# Patient Record
Sex: Female | Born: 1975 | Race: Black or African American | Hispanic: No | Marital: Married | Smoking: Never smoker
Health system: Southern US, Community
[De-identification: ages and names within clinical notes are randomized; demographics above are authoritative.]

## PROBLEM LIST (undated history)

## (undated) ENCOUNTER — Inpatient Hospital Stay (HOSPITAL_COMMUNITY): Payer: Self-pay

## (undated) DIAGNOSIS — E041 Nontoxic single thyroid nodule: Secondary | ICD-10-CM

## (undated) DIAGNOSIS — G43909 Migraine, unspecified, not intractable, without status migrainosus: Secondary | ICD-10-CM

## (undated) HISTORY — PX: THYROID CYST EXCISION: SHX2511

## (undated) HISTORY — PX: FOOT SURGERY: SHX648

---

## 2002-01-09 ENCOUNTER — Other Ambulatory Visit: Admission: RE | Admit: 2002-01-09 | Discharge: 2002-01-09 | Payer: Self-pay | Admitting: Family Medicine

## 2003-09-23 ENCOUNTER — Ambulatory Visit (HOSPITAL_COMMUNITY): Admission: RE | Admit: 2003-09-23 | Discharge: 2003-09-23 | Payer: Self-pay | Admitting: Unknown Physician Specialty

## 2005-02-02 ENCOUNTER — Ambulatory Visit (HOSPITAL_COMMUNITY): Admission: RE | Admit: 2005-02-02 | Discharge: 2005-02-02 | Payer: Self-pay | Admitting: *Deleted

## 2006-08-02 ENCOUNTER — Ambulatory Visit (HOSPITAL_COMMUNITY): Admission: RE | Admit: 2006-08-02 | Discharge: 2006-08-02 | Payer: Self-pay | Admitting: Family Medicine

## 2007-05-30 ENCOUNTER — Ambulatory Visit (HOSPITAL_COMMUNITY): Admission: RE | Admit: 2007-05-30 | Discharge: 2007-05-30 | Payer: Self-pay | Admitting: Family Medicine

## 2007-07-09 ENCOUNTER — Inpatient Hospital Stay (HOSPITAL_COMMUNITY): Admission: AD | Admit: 2007-07-09 | Discharge: 2007-07-09 | Payer: Self-pay | Admitting: Obstetrics and Gynecology

## 2007-07-27 ENCOUNTER — Inpatient Hospital Stay (HOSPITAL_COMMUNITY): Admission: AD | Admit: 2007-07-27 | Discharge: 2007-07-27 | Payer: Self-pay | Admitting: Obstetrics and Gynecology

## 2008-02-21 ENCOUNTER — Inpatient Hospital Stay (HOSPITAL_COMMUNITY): Admission: AD | Admit: 2008-02-21 | Discharge: 2008-02-24 | Payer: Self-pay | Admitting: Obstetrics and Gynecology

## 2009-01-24 ENCOUNTER — Ambulatory Visit (HOSPITAL_COMMUNITY): Admission: RE | Admit: 2009-01-24 | Discharge: 2009-01-24 | Payer: Self-pay | Admitting: Family Medicine

## 2009-07-15 ENCOUNTER — Observation Stay (HOSPITAL_COMMUNITY): Admission: AD | Admit: 2009-07-15 | Discharge: 2009-07-16 | Payer: Self-pay | Admitting: Obstetrics and Gynecology

## 2010-02-20 ENCOUNTER — Inpatient Hospital Stay (HOSPITAL_COMMUNITY): Admission: AD | Admit: 2010-02-20 | Discharge: 2010-02-22 | Payer: Self-pay | Admitting: Obstetrics and Gynecology

## 2010-07-22 ENCOUNTER — Encounter: Payer: Self-pay | Admitting: Internal Medicine

## 2010-08-02 ENCOUNTER — Encounter: Payer: Self-pay | Admitting: Internal Medicine

## 2010-08-02 LAB — CONVERTED CEMR LAB
ALT: 13 units/L
AST: 16 units/L
Alkaline Phosphatase: 46 units/L
Amylase: 40 units/L
Hemoglobin: 12.6 g/dL
Lipase: 15 units/L
MCV: 80 fL
Platelets: 227 10*3/uL
Total Bilirubin: 0.6 mg/dL
WBC: 8.9 10*3/uL

## 2010-08-10 ENCOUNTER — Encounter: Payer: Self-pay | Admitting: Internal Medicine

## 2010-08-10 ENCOUNTER — Emergency Department (HOSPITAL_COMMUNITY): Admission: RE | Admit: 2010-08-10 | Discharge: 2010-08-10 | Payer: Self-pay | Admitting: Internal Medicine

## 2010-08-11 ENCOUNTER — Encounter: Payer: Self-pay | Admitting: Internal Medicine

## 2010-08-12 ENCOUNTER — Encounter (INDEPENDENT_AMBULATORY_CARE_PROVIDER_SITE_OTHER): Payer: Self-pay | Admitting: *Deleted

## 2010-09-27 ENCOUNTER — Ambulatory Visit: Payer: Self-pay | Admitting: Internal Medicine

## 2010-09-27 DIAGNOSIS — G43909 Migraine, unspecified, not intractable, without status migrainosus: Secondary | ICD-10-CM | POA: Insufficient documentation

## 2010-09-27 DIAGNOSIS — R1033 Periumbilical pain: Secondary | ICD-10-CM | POA: Insufficient documentation

## 2010-12-14 NOTE — Letter (Signed)
Summary: New Patient letter  [facility]  [address] [place], [place] [address]   Phone: [phone]  Fax: [phone]       08/12/2010 MRN: [id number]  [name] [address] [place], [place]  [address]  Dear Ms. [facility],  Welcome to the Gastroenterology Division at [facility].    You are scheduled to see Dr. Leone Payor on 09/27/2010 at 3:00pm on the 3rd floor at [facility], [address]  We ask that you try to arrive at our office 15 minutes prior to your appointment time to allow for check-in.  We would like you to complete the enclosed self-administered evaluation form prior to your visit and bring it with you on the day of your appointment.  We will review it with you.  Also, please bring a complete list of all your medications or, if you prefer, bring the medication bottles and we will list them.  Please bring your insurance card so that we may make a copy of it.  If your insurance requires a referral to see a specialist, please bring your referral form from your primary care physician.  Co-payments are due at the time of your visit and may be paid by cash, check or credit card.     Your office visit will consist of a consult with your physician (includes a physical exam), any laboratory testing he/she may order, scheduling of any necessary diagnostic testing (e.g. x-ray, ultrasound, CT-scan), and scheduling of a procedure (e.g. Endoscopy, Colonoscopy) if required.  Please allow enough time on your schedule to allow for any/all of these possibilities.    If you cannot keep your appointment, please call [phone] to cancel or reschedule prior to your appointment date.  This allows [place] the opportunity to schedule an appointment for another patient in need of care.  If you do not cancel or reschedule by 5 p.m. the business day prior to your appointment date, you will be charged a $50.00 late cancellation/no-show fee.    Thank you for  choosing [facility] for your medical needs.  We appreciate the opportunity to care for you.  Please visit [place] at our website  to learn more about our practice.                     Sincerely,                                                             The Gastroenterology Division

## 2010-12-14 NOTE — Letter (Signed)
Summary: [facility]   Imported By: Lennie Odor 10/05/2010 09:58:31  _____________________________________________________________________  External Attachment:    Type:   Image     Comment:   External Document

## 2010-12-14 NOTE — Letter (Signed)
Summary: [facility]   Imported By: Lennie Odor 10/05/2010 09:59:52  _____________________________________________________________________  External Attachment:    Type:   Image     Comment:   External Document

## 2010-12-14 NOTE — Assessment & Plan Note (Signed)
Summary: abdomain pain/lk   History of Present Illness Visit Type: Initial Visit Primary GI MD: Stan Head MD [facility] Primary Provider: [facility] Chief Complaint: Abdominal pain History of Present Illness:   April 26, 2010 she developed sudden upper abdomen pain with with radiation into rectum and vagina. She had a child in april, vaginal delivery and she ?ed if related. She had recurrent pain about every week, lasts 3 days, without obvious trigers. She feels nauseous most of time. She is awake frequently with child but in general, unless in a 3 day spell of pain she is not having nocturnal symtoms. Bowels ok. Tried laxatives, Coke (to settle stomach), teas, giner ale, heating pad with variable results bt no complete relief.   GI Review of Systems    Reports abdominal pain, bloating, and  nausea.     Location of  Abdominal pain: upper abdomen.    Denies acid reflux, belching, chest pain, dysphagia with liquids, dysphagia with solids, heartburn, loss of appetite, vomiting, vomiting blood, weight loss, and  weight gain.      Reports hemorrhoids.     Denies anal fissure, black tarry stools, change in bowel habit, constipation, diarrhea, diverticulosis, fecal incontinence, heme positive stool, irritable bowel syndrome, jaundice, light color stool, liver problems, rectal bleeding, and  rectal pain. Preventive Screening-Counseling & Management  Alcohol-Tobacco     Smoking Status: never      Drug Use:  no.      Current Medications (verified): 1)  Imitrex Statdose System 4 Mg/0.49ml Soln (Sumatriptan Succinate) .... As Needed For Migraine Headaches  Allergies (verified): 1)  ! * Ivp Dye  Past History:  Past Medical History: Reviewed history from 09/24/2010 and no changes required. Thyroid Tumor Chronic Headaches  Past Surgical History: Reviewed history from 09/24/2010 and no changes required. 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  Alcohol Use - no Daily Caffeine Use Illicit Drug Use - no Smoking Status:  never Drug Use:  no  Review of Systems       Chronic migraines, actually a bit better these days All other ROS negative except as per HPI.   Vital Signs:  Patient profile:   35 year old female Height:      67 inches Weight:      161.38 pounds BMI:     25.37 Pulse rate:   72 / minute Pulse rhythm:   regular BP sitting:   90 / 60  (left arm) Cuff size:   regular  Vitals Entered By: June McMurray CMA Duncan Dull) (September 27, 2010 3:25 PM)  Physical Exam  General:  Well developed, well nourished, no acute distress. Eyes:  PERRLA, no icterus. Mouth:  No deformity or lesions, dentition normal. Neck:  Supple; no masses or thyromegaly. Lungs:  Clear throughout to auscultation. Heart:  Regular rate and rhythm; no murmurs, rubs,  or bruits. Abdomen:  soft, mildly distended BS+ minimally tender with deep palpation in mid-abdomen no masses Extremities:  No clubbing, cyanosis, edema or deformities noted. Cervical Nodes:  No significant cervical or supraclavicular adenopathy.  Psych:  Alert and cooperative. Normal mood and affect.  Old office nores reviewed. CBC, CMET, lipase, amylase, hCG negative 08/02/10  Impression & Recommendations:  Problem # 1:  ABDOMINAL PAIN, PERIUMBILICAL (ICD-789.05) Assessment New The location, characteristics of onset and negative to work-up to date raise possibility of abdominal migraine especially  since she has migraine headaches. I have asked her to try her Imitrex with next attack and to let me know results of that therapeutic trial. If that is helpful then will consider suppressive therapy with tricyclic.  These problems began about 2 months after delivery of fifth child but do not seem to be related.  Problem # 2:  MIGRAINE HEADACHE (ICD-346.90) Assessment: New  CT  Abdomen/Pelvis  Procedure date:  08/10/2010  Findings:      Prominence of left gonadal vein Numbness of face, tongue and throat - suspected contrast reaction  otherwise negative   Patient Instructions: 1)  Try taking Imitrex when you get abdominal pain. 2)  Call our office to let us know how this works. 3)  Information on Abdominal Migraine given. 4)  Copy sent to : Elfredia Nevins, MD 5)  The medication list was reviewed and reconciled.  All changed / newly prescribed medications were explained.  A complete medication list was provided to the patient / caregiver.

## 2011-02-02 LAB — CBC
HCT: 29.6 % — ABNORMAL LOW (ref 36.0–46.0)
HCT: 32.2 % — ABNORMAL LOW (ref 36.0–46.0)
Hemoglobin: 10.3 g/dL — ABNORMAL LOW (ref 12.0–15.0)
Hemoglobin: 9.5 g/dL — ABNORMAL LOW (ref 12.0–15.0)
MCHC: 32 g/dL (ref 30.0–36.0)
MCHC: 32.2 g/dL (ref 30.0–36.0)
MCV: 76.1 fL — ABNORMAL LOW (ref 78.0–100.0)
MCV: 76.2 fL — ABNORMAL LOW (ref 78.0–100.0)
Platelets: 149 10*3/uL — ABNORMAL LOW (ref 150–400)
Platelets: 197 10*3/uL (ref 150–400)
RBC: 3.89 MIL/uL (ref 3.87–5.11)
RBC: 4.23 MIL/uL (ref 3.87–5.11)
RDW: 18.7 % — ABNORMAL HIGH (ref 11.5–15.5)
RDW: 18.9 % — ABNORMAL HIGH (ref 11.5–15.5)
WBC: 11.5 10*3/uL — ABNORMAL HIGH (ref 4.0–10.5)
WBC: 14 10*3/uL — ABNORMAL HIGH (ref 4.0–10.5)

## 2011-02-02 LAB — RPR: RPR Ser Ql: NONREACTIVE

## 2011-02-18 LAB — URINALYSIS, ROUTINE W REFLEX MICROSCOPIC
Bilirubin Urine: NEGATIVE
Glucose, UA: NEGATIVE mg/dL
Hgb urine dipstick: NEGATIVE
Ketones, ur: NEGATIVE mg/dL
Nitrite: NEGATIVE
Protein, ur: NEGATIVE mg/dL
Specific Gravity, Urine: >1.03 — ABNORMAL HIGH (ref 1.005–1.030)
Urobilinogen, UA: 0.2 mg/dL (ref 0.0–1.0)
pH: 5.5 (ref 5.0–8.0)

## 2011-04-01 NOTE — H&P (Signed)
[name], [name] NO.:  [id number]   MEDICAL RECORD NO.:  [id number]          PATIENT TYPE:  AMB   LOCATION:  DAY                           FACILITY:  [facility]   PHYSICIAN:  Langley Gauss, MD     DATE OF BIRTH:  1976/01/01   DATE OF ADMISSION:  02/02/2005  DATE OF DISCHARGE:  LH                                HISTORY & PHYSICAL   Planned procedure is suction, dilatation, and curettage.   The patient is a 35 year old gravida 5, para 3 with a last menstrual period  sometime in January 2006 who is referred for suction D&C for findings of  missed AB. Pertinently, by patient's history, she had a positive pregnancy  test in February. About two weeks previously, she underwent elective  termination of pregnancy at another facility. The patient states that no  ultrasound was performed prior to the procedure, but history indicates that  this was a first trimester done utilizing a local and was very poorly  tolerated by the patient. The patient states that she was seen one week  followup status post procedure at that clinic at which time it was noted  that her findings were normal. The patient subsequently has contacted them,  stating that she still has pregnancy symptoms, nausea and vomiting as well  as mild pelvic cramping like onset of menses, but no vaginal bleeding. She  has been notified that she is not any longer pregnant. The patient  subsequently, however, has been seen at [facility] at which time a  quantitative beta HCG was performed on January 31, 2005 with a result obtained  of 37,204.   The patient did follow up appropriately following the termination. She also  recalls taking Vibramycin for antibiotic coverage.   PAST MEDICAL HISTORY:  She has three prior vaginal deliveries without  complications, most recent being October 27, 2000. She has no known drug  allergies. She does have a history of a benign cyst removed from her thyroid  but is not  requiring any thyroid replacement therapy.   SOCIAL HISTORY:  The patient is a nonsmoker. She works for [employer].   PHYSICAL EXAMINATION:  GENERAL:  She is no acute distress.  VITAL SIGNS:  Weight is 134, blood pressure is 110/58, pulse rate 80,  respiratory rate of 20.  HEENT:  Negative. No adenopathy. Neck is supple. Mucous membranes are moist.  LUNGS:  Clear.  CARDIOVASCULAR:  Regular rate and rhythm.  ABDOMEN:  Soft and nontender. No surgical scars are identified. No pelvic  masses are appreciated.  EXTREMITIES:  Normal.  PELVIC:  Normal. External genitalia no lesions or ulcerations identified.  Cervix without lesions. No vaginal bleeding or abnormal discharge is  identified. Bimanual examination reveals a sharply retroflexed uterus,  slightly enlarged. No adnexal tenderness. No adnexal pain.   A transvaginal ultrasound is performed in the office, interpreted by Dr.  Roylene Reason. Lisette Grinder which reveals normal appearing left and right ovaries,  small amount of free fluid within the pelvis; identified within the uterus,  though, is a well defined fluid collection which contains a fetal  pole  consistent with 7 weeks and 4 days, no fetal movement, no fetal cardiac  activity is identified.   ASSESSMENT/PLAN:  The patient is status post termination of pregnancy;  however, as the patient states, this was done utilizing local analgesic only  and was poorly tolerated by the patient. It was complicated by her markedly  retroflexed uterus. Impression is that of incomplete termination of the  pregnancy with what appears to be a fetal pole now present with no cardiac  activity. Thus, diagnosis is missed abortion. The patient is scheduled for  suction D&C performed February 02, 2005 rather than wait for the onset of  vaginal bleeding. Of note, patient had again been in contact with the clinic  performing the TOP at which time she was given what appears to be two  Cytotec to be  taken orally and two to be placed intravaginally. Risks and  benefits of the surgical procedure discussed with the patient. This is to be  performed on February 02, 2005.      DC/MEDQ  D:  02/02/2005  T:  02/02/2005  Job:  [id number]

## 2011-04-01 NOTE — Op Note (Signed)
NAMESHERL, YZAGUIRRE NO.:  [id number]   MEDICAL RECORD NO.:  [id number]          PATIENT TYPE:  AMB   LOCATION:  DAY                           FACILITY:  [facility]   PHYSICIAN:  Langley Gauss, MD     DATE OF BIRTH:  Nov 06, 1976   DATE OF PROCEDURE:  02/02/2005  DATE OF DISCHARGE:  02/02/2005                                 OPERATIVE REPORT   PREOPERATIVE DIAGNOSES:  First trimester pregnancy estimated at 10-12, weeks  status post incomplete elective termination of pregnancy.  Diagnoses thus is  complication of induced abortion.   PROCEDURE PERFORMED:  Suction D&C.   SURGEON:  Dr. Roylene Reason. Lisette Grinder.   ANESTHESIA:  General endotracheal.   SPECIMENS:  Obvious products conception to pathology laboratory for  permanent section only.   COMPLICATIONS:  None.   ESTIMATED BLOOD LOSS:  100 cc.   SUMMARY:  The patient had been evaluated preoperatively, was noted to have  elevated quantitative beta HCG of 37, 204 an ultrasound consistent with  retained products conception. The patient however was not having any vaginal  bleeding. She was taken the operating vital signs were stable. She underwent  uncomplicated induction of general anesthesia at which time she was placed  in dorsolithotomy position, prepped and draped usual sterile manner. Sterile  speculum examination was performed. There was some brown mucusy discharge  present at the endocervix.  The cervix was noted to be dilated sufficiently  to allow passage of a #8 suction catheter tip.  The anterior lip of cervix  was grasped with a single-tooth tenaculum. An 8 mm suction catheter tip was  then inserted into the uterine cavity. Gentle suction is then applied with a  circular withdrawing motion. The entire uterine cavity was suctioned of  obvious retained products of conception. This was repeated times one with  the suction catheter tip reintroduced up to the level of the fundus.  Again  suction curettage  performed the entire uterine cavity. Minimal tissues  obtained on the second pass.  A fine banjo curettage was then performed of  the uterine cavity until a fine gritty sensation was appreciated in all  quadrants of the uterus to include the uterine fundus no additional tissue  was obtained. Thus the procedure was terminated. Bimanual examination  revealed a 10-week size uterus.  Fundal massage was performed to improve  uterine tone.  Minimal active bleeding is noted. The patient is treated with  0.2 milligrams of IM Methergine.  She is reversed of anesthesia, taken out  of dorsolithotomy position and taken to the recovery room in stable  condition. The patient did have a family member waiting who was well aware  of the patient's recent termination of pregnancy, difficulties she has had  subsequent to performance of that procedure and she was made aware that no  complications were obtained of during today's procedure.   DISPOSITION DISCHARGE SUMMARY:  Operative procedure performed on February 03, 2003.  The patient that did well postoperatively in the recovery room, had  minimal vaginal bleeding and cramping was controlled utilizing IM Toradol.  The patient's vital signs remained  stable. She has satisfied all criteria  for discharge, was thus discharged home on February 03, 2003.  Patient given  copy standard discharge instructions.  She is to follow-up in the office in  one weeks' time at which time pathology results can be reviewed as well as  discussion of birth control options.   PERTINENT LABORATORY STUDIES:  O+ blood type, hemoglobin 11.0, hematocrit  32.4.   MEDICATIONS:  That the patient has started p.o. doxycycline two days prior  to the procedure.  This will be continued b.i.d. times five days  postoperatively.  She is also given a prescription for Vicoprofen #30 with  no refill for postoperative pain relief   The patient did state to me that the initial operative procedure had  been  performed in [facility]. This was elective termination pregnancy. The  patient has multiple questions regarding that procedure and why it was  reperformed at this time.  I told her I was unable to specifically answer  any questions regarding the initial performance of the procedure but did  state to her that undoubtedly, the initial procedure was technically  markedly more difficult than today's procedure as in the outpatient clinic,  certainly probably a paracervical block had been utilized where as today  utilizing general anesthesia, the patient tolerance was complete.      DC/MEDQ  D:  03/02/2005  T:  03/02/2005  Job:  [id number]

## 2011-04-07 ENCOUNTER — Ambulatory Visit (HOSPITAL_COMMUNITY)
Admission: RE | Admit: 2011-04-07 | Discharge: 2011-04-07 | Disposition: A | Discharge status: Home / Self Care | Payer: Managed Care, Other (non HMO) | Source: Ambulatory Visit | Attending: Preventative Medicine | Admitting: Preventative Medicine

## 2011-04-07 ENCOUNTER — Other Ambulatory Visit (HOSPITAL_COMMUNITY): Payer: Self-pay | Admitting: Preventative Medicine

## 2011-04-07 DIAGNOSIS — S0990XA Unspecified injury of head, initial encounter: Secondary | ICD-10-CM | POA: Insufficient documentation

## 2011-04-07 DIAGNOSIS — R51 Headache: Secondary | ICD-10-CM | POA: Insufficient documentation

## 2011-05-16 ENCOUNTER — Inpatient Hospital Stay (HOSPITAL_COMMUNITY)
Admission: AD | Admit: 2011-05-16 | Discharge: 2011-05-16 | Disposition: A | Discharge status: Home / Self Care | Payer: Managed Care, Other (non HMO) | Source: Ambulatory Visit | Attending: Obstetrics and Gynecology | Admitting: Obstetrics and Gynecology

## 2011-05-16 DIAGNOSIS — O210 Mild hyperemesis gravidarum: Secondary | ICD-10-CM

## 2011-05-16 LAB — COMPREHENSIVE METABOLIC PANEL
ALT: 34 U/L (ref 0–35)
AST: 22 U/L (ref 0–37)
Albumin: 3.5 g/dL (ref 3.5–5.2)
Alkaline Phosphatase: 49 U/L (ref 39–117)
BUN: 13 mg/dL (ref 6–23)
CO2: 25 mEq/L (ref 19–32)
Calcium: 9.3 mg/dL (ref 8.4–10.5)
Chloride: 96 mEq/L (ref 96–112)
Creatinine, Ser: 0.48 mg/dL — ABNORMAL LOW (ref 0.50–1.10)
GFR calc Af Amer: >60 mL/min (ref >60)
GFR calc non Af Amer: >60 mL/min (ref >60)
Glucose, Bld: 97 mg/dL (ref 70–99)
Potassium: 3.6 mEq/L (ref 3.5–5.1)
Sodium: 130 mEq/L — ABNORMAL LOW (ref 135–145)
Total Bilirubin: 0.4 mg/dL (ref 0.3–1.2)
Total Protein: 7 g/dL (ref 6.0–8.3)

## 2011-05-16 LAB — CBC
HCT: 34.3 % — ABNORMAL LOW (ref 36.0–46.0)
Hemoglobin: 11.2 g/dL — ABNORMAL LOW (ref 12.0–15.0)
MCH: 25 pg — ABNORMAL LOW (ref 26.0–34.0)
MCHC: 32.7 g/dL (ref 30.0–36.0)
MCV: 76.6 fL — ABNORMAL LOW (ref 78.0–100.0)
Platelets: 277 10*3/uL (ref 150–400)
RBC: 4.48 MIL/uL (ref 3.87–5.11)
RDW: 15.3 % (ref 11.5–15.5)
WBC: 11.3 10*3/uL — ABNORMAL HIGH (ref 4.0–10.5)

## 2011-05-16 LAB — URINE MICROSCOPIC-ADD ON

## 2011-05-16 LAB — URINALYSIS, ROUTINE W REFLEX MICROSCOPIC
Bilirubin Urine: NEGATIVE
Glucose, UA: NEGATIVE mg/dL
Ketones, ur: 15 mg/dL — AB
Leukocytes, UA: NEGATIVE
Nitrite: NEGATIVE
Protein, ur: 30 mg/dL — AB
Specific Gravity, Urine: >1.03 — ABNORMAL HIGH (ref 1.005–1.030)
Urobilinogen, UA: 0.2 mg/dL (ref 0.0–1.0)
pH: 6 (ref 5.0–8.0)

## 2011-05-26 LAB — GC/CHLAMYDIA PROBE AMP, GENITAL: Gonorrhea: NEGATIVE

## 2011-05-26 LAB — ABO/RH

## 2011-08-09 LAB — RPR: RPR Ser Ql: NONREACTIVE

## 2011-08-09 LAB — CBC
HCT: 27.7 — ABNORMAL LOW
HCT: 32.4 — ABNORMAL LOW
Hemoglobin: 10.7 — ABNORMAL LOW
Hemoglobin: 9.2 — ABNORMAL LOW
MCHC: 33.2
MCHC: 33.2
MCV: 77.5 — ABNORMAL LOW
MCV: 78.4
Platelets: 172
Platelets: 200
RBC: 3.53 — ABNORMAL LOW
RBC: 4.18
RDW: 16.3 — ABNORMAL HIGH
RDW: 16.3 — ABNORMAL HIGH
WBC: 12.4 — ABNORMAL HIGH
WBC: 16.6 — ABNORMAL HIGH

## 2011-08-26 LAB — URINALYSIS, DIPSTICK ONLY
Glucose, UA: NEGATIVE
Hgb urine dipstick: NEGATIVE
Ketones, ur: 15 — AB
Protein, ur: NEGATIVE

## 2011-10-16 ENCOUNTER — Encounter (HOSPITAL_COMMUNITY): Payer: Self-pay

## 2011-10-16 ENCOUNTER — Inpatient Hospital Stay (HOSPITAL_COMMUNITY): Payer: Managed Care, Other (non HMO)

## 2011-10-16 ENCOUNTER — Inpatient Hospital Stay (HOSPITAL_COMMUNITY)
Admission: AD | Admit: 2011-10-16 | Discharge: 2011-10-16 | Disposition: A | Discharge status: Home / Self Care | Payer: Managed Care, Other (non HMO) | Source: Ambulatory Visit | Attending: Obstetrics and Gynecology | Admitting: Obstetrics and Gynecology

## 2011-10-16 DIAGNOSIS — J069 Acute upper respiratory infection, unspecified: Secondary | ICD-10-CM | POA: Insufficient documentation

## 2011-10-16 DIAGNOSIS — O26859 Spotting complicating pregnancy, unspecified trimester: Secondary | ICD-10-CM | POA: Insufficient documentation

## 2011-10-16 DIAGNOSIS — O99891 Other specified diseases and conditions complicating pregnancy: Secondary | ICD-10-CM | POA: Insufficient documentation

## 2011-10-16 LAB — CBC
HCT: 33.8 % — ABNORMAL LOW (ref 36.0–46.0)
MCHC: 31.4 g/dL (ref 30.0–36.0)
MCV: 77.9 fL — ABNORMAL LOW (ref 78.0–100.0)
RDW: 18.9 % — ABNORMAL HIGH (ref 11.5–15.5)

## 2011-10-16 MED ORDER — AMOXICILLIN-POT CLAVULANATE 500-125 MG PO TABS: 1.0000 | ORAL_TABLET | Freq: Three times a day (TID) | ORAL | Status: AC

## 2011-10-16 NOTE — Progress Notes (Signed)
Pt presents to MAU with chief complaint of vaginal bleeding that started today at 11:00; Pt is [redacted]w[redacted]d pregnant. pt noticed the bleeding in the toilet and when she wiped. Pt was wearing a pad when she arrived- small amount, bright red, no complaints of cramping.

## 2011-10-16 NOTE — ED Provider Notes (Cosign Needed)
History     CSN: [id number] Arrival date & time: 10/16/2011 12:13 PM   None     Chief Complaint  Patient presents with  . Vaginal Bleeding    (Consider location/radiation/quality/duration/timing/severity/associated sxs/prior treatment) HPI [name]  Is a 35 y.o. [id number] At 30 4/[redacted] wks EGA who presents with c/o vaginal bleeding since 1100. Is bright red, no clots, no cramping. Is on tissue with wiping. Good fetal activity.  No past medical history on file.  No past surgical history on file.  No family history on file.  History  Substance Use Topics  . Smoking status: Not on file  . Smokeless tobacco: Not on file  . Alcohol Use: Not on file    OB History    Grav Para Term Preterm Abortions TAB SAB Ect Mult Living   7 5  1 1 1    5       Review of Systems  Constitutional: Negative for fever and chills.  Genitourinary: Positive for vaginal bleeding. Negative for vaginal discharge.    Allergies  Iohexol  Home Medications  No current outpatient prescriptions on file.  BP 118/48  Pulse 84  Temp(Src) 97.9 F (36.6 C) (Oral)  Resp 18  Ht 5\' 7"  (1.702 m)  Wt 84.278 kg (185 lb 12.8 oz)  BMI 29.10 kg/m2  Physical Exam  Constitutional: She is oriented to person, place, and time. She appears well-developed and well-nourished.  Musculoskeletal: Normal range of motion.  Neurological: She is alert and oriented to person, place, and time.  Skin: Skin is warm and dry.  Psychiatric: She has a normal mood and affect. Her behavior is normal.    ED Course  Procedures (including critical care time) Consulted with Dr Vincente Poli, get U/S, she will come see the pt after her deilvery  Labs Reviewed  CBC   No results found.   No diagnosis found.    MDM          Avon Gully. Dayla Gasca 10/16/11 1630

## 2011-10-16 NOTE — Progress Notes (Signed)
Onset of red vaginal bleeding around 11:00 no cramping, patient is 30 weeks, positive fetal movement.

## 2011-10-16 NOTE — ED Provider Notes (Signed)
35 year old G 7 P0115 came to MAU complaining of 1 day of spotting no pain. Spotting now resolved. She also has URI with thick yellow discharge and requests antibiotic. Prenatal care uncomplicated Afebrile vital signs stable Fetal heart rate is reactive Tocometer is negative for contractions Abdomen is soft and non tender Ultrasound No placental abruption or previa AFI is normal Cervix is 4.7 cm long and appears normal  Impression: Spotting resolved Glenford Peers  Plan Send home with bleeding precautions zpack

## 2011-11-16 LAB — STREP B DNA PROBE: GBS: NEGATIVE

## 2011-12-16 ENCOUNTER — Inpatient Hospital Stay (HOSPITAL_COMMUNITY)
Admission: AD | Admit: 2011-12-16 | Discharge: 2011-12-18 | DRG: 775 | Disposition: A | Discharge status: Home / Self Care | Payer: Managed Care, Other (non HMO) | Source: Ambulatory Visit | Attending: Obstetrics and Gynecology | Admitting: Obstetrics and Gynecology

## 2011-12-16 ENCOUNTER — Inpatient Hospital Stay (HOSPITAL_COMMUNITY): Payer: Managed Care, Other (non HMO) | Admitting: Anesthesiology

## 2011-12-16 ENCOUNTER — Encounter (HOSPITAL_COMMUNITY): Payer: Self-pay

## 2011-12-16 ENCOUNTER — Encounter (HOSPITAL_COMMUNITY): Payer: Self-pay | Admitting: Anesthesiology

## 2011-12-16 DIAGNOSIS — O09529 Supervision of elderly multigravida, unspecified trimester: Principal | ICD-10-CM | POA: Diagnosis present

## 2011-12-16 HISTORY — DX: Nontoxic single thyroid nodule: E04.1

## 2011-12-16 HISTORY — DX: Migraine, unspecified, not intractable, without status migrainosus: G43.909

## 2011-12-16 LAB — CBC
HCT: 35.1 % — ABNORMAL LOW (ref 36.0–46.0)
Hemoglobin: 11.6 g/dL — ABNORMAL LOW (ref 12.0–15.0)
WBC: 15.7 10*3/uL — ABNORMAL HIGH (ref 4.0–10.5)

## 2011-12-16 MED ORDER — FLEET ENEMA 7-19 GM/118ML RE ENEM: 1.0000 | ENEMA | RECTAL | Status: DC | PRN

## 2011-12-16 MED ORDER — LACTATED RINGERS IV SOLN: INTRAVENOUS | Status: DC

## 2011-12-16 MED ORDER — LACTATED RINGERS IV SOLN: 500.0000 mL | INTRAVENOUS | Status: DC | PRN

## 2011-12-16 MED ORDER — ONDANSETRON HCL 4 MG/2ML IJ SOLN: 4.0000 mg | Freq: Four times a day (QID) | INTRAMUSCULAR | Status: DC | PRN

## 2011-12-16 MED ORDER — OXYTOCIN 20 UNITS IN LACTATED RINGERS INFUSION - SIMPLE
125.0000 mL/h | Freq: Once | INTRAVENOUS | Status: AC
  Administered 2011-12-17: 125 mL/h via INTRAVENOUS
  Filled 2011-12-16: qty 1000

## 2011-12-16 MED ORDER — EPHEDRINE 5 MG/ML INJ
10.0000 mg | INTRAVENOUS | Status: DC | PRN
  Filled 2011-12-16: qty 4

## 2011-12-16 MED ORDER — PHENYLEPHRINE 40 MCG/ML (10ML) SYRINGE FOR IV PUSH (FOR BLOOD PRESSURE SUPPORT)
80.0000 ug | PREFILLED_SYRINGE | INTRAVENOUS | Status: DC | PRN
  Filled 2011-12-16: qty 5

## 2011-12-16 MED ORDER — FENTANYL 2.5 MCG/ML BUPIVACAINE 1/10 % EPIDURAL INFUSION (WH - ANES)
14.0000 mL/h | INTRAMUSCULAR | Status: DC
  Filled 2011-12-16: qty 60

## 2011-12-16 MED ORDER — OXYCODONE-ACETAMINOPHEN 5-325 MG PO TABS: 1.0000 | ORAL_TABLET | ORAL | Status: DC | PRN

## 2011-12-16 MED ORDER — OXYTOCIN BOLUS FROM INFUSION
500.0000 mL | Freq: Once | INTRAVENOUS | Status: AC
  Administered 2011-12-16: 500 mL via INTRAVENOUS
  Filled 2011-12-16: qty 500

## 2011-12-16 MED ORDER — CITRIC ACID-SODIUM CITRATE 334-500 MG/5ML PO SOLN: 30.0000 mL | ORAL | Status: DC | PRN

## 2011-12-16 MED ORDER — DIPHENHYDRAMINE HCL 50 MG/ML IJ SOLN: 12.5000 mg | INTRAMUSCULAR | Status: DC | PRN

## 2011-12-16 MED ORDER — LIDOCAINE HCL (PF) 1 % IJ SOLN: 30.0000 mL | INTRAMUSCULAR | Status: DC | PRN

## 2011-12-16 MED ORDER — FENTANYL 2.5 MCG/ML BUPIVACAINE 1/10 % EPIDURAL INFUSION (WH - ANES)
INTRAMUSCULAR | Status: DC | PRN
  Administered 2011-12-16: 14 mL/h via EPIDURAL

## 2011-12-16 MED ORDER — ACETAMINOPHEN 325 MG PO TABS: 650.0000 mg | ORAL_TABLET | ORAL | Status: DC | PRN

## 2011-12-16 MED ORDER — EPHEDRINE 5 MG/ML INJ: 10.0000 mg | INTRAVENOUS | Status: DC | PRN

## 2011-12-16 MED ORDER — PHENYLEPHRINE 40 MCG/ML (10ML) SYRINGE FOR IV PUSH (FOR BLOOD PRESSURE SUPPORT): 80.0000 ug | PREFILLED_SYRINGE | INTRAVENOUS | Status: DC | PRN

## 2011-12-16 MED ORDER — LIDOCAINE HCL 1.5 % IJ SOLN
INTRAMUSCULAR | Status: DC | PRN
  Administered 2011-12-16 (×2): 4 mL via EPIDURAL

## 2011-12-16 MED ORDER — LACTATED RINGERS IV SOLN
500.0000 mL | Freq: Once | INTRAVENOUS | Status: AC
  Administered 2011-12-16: 500 mL via INTRAVENOUS

## 2011-12-16 MED ORDER — IBUPROFEN 600 MG PO TABS: 600.0000 mg | ORAL_TABLET | Freq: Four times a day (QID) | ORAL | Status: DC | PRN

## 2011-12-16 NOTE — Progress Notes (Signed)
Delivery Note  Rapid second stage, ROP SVD VFI Apgars 9/9 Weight/art pH pending Placenta 3 vessels, intact, EBL 200cc Cx/vag intact First degree small lac of perineum, not bleeding, not repaired Pt and infant stable in LDR

## 2011-12-16 NOTE — Anesthesia Procedure Notes (Signed)
Epidural Patient location during procedure: OB Start time: 12/16/2011 10:40 PM  Staffing Anesthesiologist: Toshiyuki Fredell A. Performed by: anesthesiologist   Preanesthetic Checklist Completed: patient identified, site marked, surgical consent, pre-op evaluation, timeout performed, IV checked, risks and benefits discussed and monitors and equipment checked  Epidural Patient position: sitting Prep: site prepped and draped and DuraPrep Patient monitoring: continuous pulse ox and blood pressure Approach: midline Injection technique: LOR air  Needle:  Needle type: Tuohy  Needle gauge: 17 G Needle length: 9 cm Needle insertion depth: 5 cm cm Catheter type: closed end flexible Catheter size: 19 Gauge Catheter at skin depth: 10 cm Test dose: negative and 1.5% lidocaine  Assessment Events: blood not aspirated, injection not painful, no injection resistance, negative IV test and no paresthesia  Additional Notes Patient identified. Risks and benefits discussed including failed block, incomplete  Pain control, post dural puncture headache, nerve damage, paralysis, blood pressure Changes, nausea, vomiting, reactions to medications-both toxic and allergic and post Partum back pain. All questions were answered. Patient expressed understanding and wished to proceed. Sterile technique was used throughout procedure. Epidural site was Dressed with sterile barrier dressing. No paresthesias, signs of intravascular injection Or signs of intrathecal spread were encountered.  Patient was more comfortable after the epidural was dosed. Please see RN's note for documentation of vital signs and FHR which are stable.

## 2011-12-16 NOTE — H&P (Signed)
[name] is a 36 y.o. female presenting for C/O uterine contractions. No ROM/CNS change/epigastric pain. Maternal Medical History:  Reason for admission: Reason for admission: contractions.    OB History    Grav Para Term Preterm Abortions TAB SAB Ect Mult Living   7 5 4 1 1 1  0 0 0 5     Past Medical History  Diagnosis Date  . Migraines   . Thyroid nodule     removed   Past Surgical History  Procedure Date  . Thyroid cyst excision    Family History: family history is negative for Anesthesia problems. Social History:  does not have a smoking history on file. She does not have any smokeless tobacco history on file. She reports that she does not drink alcohol or use illicit drugs.  Review of Systems  Eyes: Negative for blurred vision.  Gastrointestinal: Negative for abdominal pain.  Neurological: Negative for headaches.    Dilation: 3.5 Effacement (%): 70 Station: -3 Exam by:: Lucy Chris RNC Blood pressure 120/67, pulse 73, temperature 98.8 F (37.1 C), temperature source Oral. Maternal Exam:  Uterine Assessment: Contraction strength is firm.  Contraction frequency is regular.      Fetal Exam Fetal Monitor Review: Pattern: accelerations present.       Physical Exam  Cardiovascular: Normal rate and regular rhythm.   Respiratory: Effort normal and breath sounds normal.    Prenatal labs: ABO, Rh: O/Positive/-- (07/12 0000) Antibody: Negative (07/12 0000) Rubella:   RPR: Nonreactive (07/12 0000)  HBsAg: Negative (07/12 0000)  HIV: Non-reactive (07/12 0000)  GBS: Negative (01/02 0000)   Assessment/Plan: 36 yo G7P5 in active labor Admit   Cadel Stairs II,Seymour Pavlak E 12/16/2011, 9:55 PM

## 2011-12-16 NOTE — Anesthesia Preprocedure Evaluation (Signed)
Anesthesia Evaluation  Patient identified by MRN, date of birth, ID band Patient awake    Reviewed: Allergy & Precautions, H&P , Patient's Chart, lab work & pertinent test results  Airway Mallampati: III TM Distance: >3 FB Neck ROM: full    Dental No notable dental hx. (+) Teeth Intact   Pulmonary neg pulmonary ROS,  clear to auscultation  Pulmonary exam normal       Cardiovascular neg cardio ROS regular Normal    Neuro/Psych  Headaches, Negative Psych ROS   GI/Hepatic negative GI ROS, Neg liver ROS,   Endo/Other  Thyroid Nodule  Renal/GU negative Renal ROS  Genitourinary negative   Musculoskeletal   Abdominal   Peds  Hematology negative hematology ROS (+)   Anesthesia Other Findings   Reproductive/Obstetrics (+) Pregnancy                           Anesthesia Physical Anesthesia Plan  ASA: II  Anesthesia Plan: Epidural   Post-op Pain Management:    Induction:   Airway Management Planned:   Additional Equipment:   Intra-op Plan:   Post-operative Plan:   Informed Consent: I have reviewed the patients History and Physical, chart, labs and discussed the procedure including the risks, benefits and alternatives for the proposed anesthesia with the patient or authorized representative who has indicated his/her understanding and acceptance.     Plan Discussed with: Anesthesiologist and Surgeon  Anesthesia Plan Comments:         Anesthesia Quick Evaluation

## 2011-12-17 ENCOUNTER — Encounter (HOSPITAL_COMMUNITY): Payer: Self-pay | Admitting: *Deleted

## 2011-12-17 LAB — CBC
Hemoglobin: 11.3 g/dL — ABNORMAL LOW (ref 12.0–15.0)
MCH: 24.7 pg — ABNORMAL LOW (ref 26.0–34.0)
MCHC: 31.6 g/dL (ref 30.0–36.0)
MCV: 78.2 fL (ref 78.0–100.0)
RBC: 4.58 MIL/uL (ref 3.87–5.11)

## 2011-12-17 MED ORDER — IBUPROFEN 600 MG PO TABS
600.0000 mg | ORAL_TABLET | Freq: Four times a day (QID) | ORAL | Status: DC
  Administered 2011-12-17 – 2011-12-18 (×7): 600 mg via ORAL
  Filled 2011-12-17 (×7): qty 1

## 2011-12-17 MED ORDER — DIPHENHYDRAMINE HCL 25 MG PO CAPS: 25.0000 mg | ORAL_CAPSULE | Freq: Four times a day (QID) | ORAL | Status: DC | PRN

## 2011-12-17 MED ORDER — CLINDAMYCIN HCL 300 MG PO CAPS
300.0000 mg | ORAL_CAPSULE | Freq: Four times a day (QID) | ORAL | Status: DC
  Administered 2011-12-17 – 2011-12-18 (×4): 300 mg via ORAL
  Filled 2011-12-17 (×9): qty 1

## 2011-12-17 MED ORDER — OXYCODONE-ACETAMINOPHEN 5-325 MG PO TABS
1.0000 | ORAL_TABLET | ORAL | Status: DC | PRN
Start: 2011-12-17 — End: 2011-12-18
  Administered 2011-12-17 (×2): 1 via ORAL
  Administered 2011-12-18: 2 via ORAL
  Filled 2011-12-17: qty 2
  Filled 2011-12-17 (×2): qty 1

## 2011-12-17 MED ORDER — ONDANSETRON HCL 4 MG/2ML IJ SOLN: 4.0000 mg | INTRAMUSCULAR | Status: DC | PRN

## 2011-12-17 MED ORDER — SIMETHICONE 80 MG PO CHEW: 80.0000 mg | CHEWABLE_TABLET | ORAL | Status: DC | PRN

## 2011-12-17 MED ORDER — SENNOSIDES-DOCUSATE SODIUM 8.6-50 MG PO TABS
2.0000 | ORAL_TABLET | Freq: Every day | ORAL | Status: DC
  Administered 2011-12-17: 2 via ORAL

## 2011-12-17 MED ORDER — ZOLPIDEM TARTRATE 5 MG PO TABS: 5.0000 mg | ORAL_TABLET | Freq: Every evening | ORAL | Status: DC | PRN

## 2011-12-17 MED ORDER — BISACODYL 10 MG RE SUPP: 10.0000 mg | Freq: Every day | RECTAL | Status: DC | PRN

## 2011-12-17 MED ORDER — DIBUCAINE 1 % RE OINT
1.0000 "application " | TOPICAL_OINTMENT | RECTAL | Status: DC | PRN
  Administered 2011-12-18: 1 via RECTAL
  Filled 2011-12-17: qty 28

## 2011-12-17 MED ORDER — WITCH HAZEL-GLYCERIN EX PADS: 1.0000 "application " | MEDICATED_PAD | CUTANEOUS | Status: DC | PRN

## 2011-12-17 MED ORDER — PRENATAL MULTIVITAMIN CH
1.0000 | ORAL_TABLET | Freq: Every day | ORAL | Status: DC
  Administered 2011-12-17 – 2011-12-18 (×2): 1 via ORAL
  Filled 2011-12-17 (×2): qty 1

## 2011-12-17 MED ORDER — FLEET ENEMA 7-19 GM/118ML RE ENEM: 1.0000 | ENEMA | Freq: Every day | RECTAL | Status: DC | PRN

## 2011-12-17 MED ORDER — LANOLIN HYDROUS EX OINT: TOPICAL_OINTMENT | CUTANEOUS | Status: DC | PRN

## 2011-12-17 MED ORDER — ONDANSETRON HCL 4 MG PO TABS: 4.0000 mg | ORAL_TABLET | ORAL | Status: DC | PRN

## 2011-12-17 MED ORDER — TETANUS-DIPHTH-ACELL PERTUSSIS 5-2.5-18.5 LF-MCG/0.5 IM SUSP: 0.5000 mL | Freq: Once | INTRAMUSCULAR | Status: DC

## 2011-12-17 MED ORDER — BENZOCAINE-MENTHOL 20-0.5 % EX AERO: 1.0000 "application " | INHALATION_SPRAY | CUTANEOUS | Status: DC | PRN

## 2011-12-17 NOTE — Anesthesia Postprocedure Evaluation (Signed)
  Anesthesia Post-op Note  Patient: [name]  Procedure(s) Performed: * No procedures listed *  Patient Location: Mother/Baby  Anesthesia Type: Epidural  Level of Consciousness: awake, alert  and oriented  Airway and Oxygen Therapy: Patient Spontanous Breathing  Post-op Pain: mild  Post-op Assessment: Patient's Cardiovascular Status Stable, Respiratory Function Stable, Patent Airway and Pain level controlled  Post-op Vital Signs: stable  Complications: No apparent anesthesia complications

## 2011-12-17 NOTE — Progress Notes (Signed)
Ambulating, voiding, lochia decreasing  Blood pressure 92/60, pulse 67, temperature 98.2 F (36.8 C), temperature source Oral, resp. rate 16, SpO2 99.00%, unknown if currently breastfeeding.  FFNT  A: satisfactory progress P: Pt requests abdominal binder

## 2011-12-18 MED ORDER — HYDROCORTISONE ACE-PRAMOXINE 1-1 % RE FOAM: 1.0000 | Freq: Two times a day (BID) | RECTAL | Status: AC

## 2011-12-18 MED ORDER — PRAMOXINE HCL 1 % RE FOAM: Freq: Three times a day (TID) | RECTAL | Status: AC | PRN

## 2011-12-18 MED ORDER — HYDROCORTISONE ACE-PRAMOXINE 1-1 % RE FOAM
1.0000 | Freq: Two times a day (BID) | RECTAL | Status: DC
  Administered 2011-12-18: 1 via RECTAL
  Filled 2011-12-18: qty 10

## 2011-12-18 MED ORDER — PRAMOXINE HCL 1 % RE FOAM
Freq: Three times a day (TID) | RECTAL | Status: DC | PRN
  Filled 2011-12-18: qty 15

## 2011-12-18 MED ORDER — IBUPROFEN 200 MG PO TABS: 600.0000 mg | ORAL_TABLET | Freq: Four times a day (QID) | ORAL | Status: AC | PRN

## 2011-12-18 MED ORDER — OXYCODONE-ACETAMINOPHEN 5-325 MG PO TABS: 1.0000 | ORAL_TABLET | ORAL | Status: AC | PRN

## 2011-12-18 NOTE — Progress Notes (Signed)
Lochia decreasing No complaints  Blood pressure 104/70, pulse 75, temperature 97.5 F (36.4 C), temperature source Oral, resp. rate 18, SpO2 99.00%, unknown if currently breastfeeding.  FFNT  A: satisfactory progress  P: D/C home      Instructions given

## 2011-12-23 NOTE — Discharge Summary (Signed)
Obstetric Discharge Summary Reason for Admission: onset of labor Prenatal Procedures: ultrasound Intrapartum Procedures: spontaneous vaginal delivery Postpartum Procedures: none Complications-Operative and Postpartum: none Hemoglobin  Date Value Range Status  12/17/2011 11.3* 12.0-15.0 (g/dL) Final     HCT  Date Value Range Status  12/17/2011 35.8* 36.0-46.0 (%) Final    Discharge Diagnoses: Term Pregnancy-delivered  Discharge Information: Date: 12/23/2011 Activity: pelvic rest Diet: routine Medications: PNV and Percocet Condition: stable Instructions: refer to practice specific booklet Discharge to: home   Newborn Data: Live born female  Birth Weight: 7 lb 9.3 oz (3439 g) APGAR: 9, 9  Home with mother.  Marielouise Amey II,Chinmayi Rumer E 12/23/2011, 8:23 AM

## 2013-04-29 LAB — OB RESULTS CONSOLE GC/CHLAMYDIA
Chlamydia: NEGATIVE
Gonorrhea: NEGATIVE

## 2013-04-29 LAB — OB RESULTS CONSOLE RPR: RPR: NONREACTIVE

## 2013-05-03 ENCOUNTER — Encounter (HOSPITAL_COMMUNITY): Payer: Self-pay

## 2013-05-03 ENCOUNTER — Inpatient Hospital Stay (HOSPITAL_COMMUNITY)
Admission: AD | Admit: 2013-05-03 | Discharge: 2013-05-03 | Disposition: A | Discharge status: Home / Self Care | Payer: 59 | Source: Ambulatory Visit | Attending: Emergency Medicine | Admitting: Emergency Medicine

## 2013-05-03 DIAGNOSIS — R509 Fever, unspecified: Secondary | ICD-10-CM | POA: Insufficient documentation

## 2013-05-03 DIAGNOSIS — J36 Peritonsillar abscess: Secondary | ICD-10-CM

## 2013-05-03 DIAGNOSIS — Z8639 Personal history of other endocrine, nutritional and metabolic disease: Secondary | ICD-10-CM | POA: Insufficient documentation

## 2013-05-03 DIAGNOSIS — O98819 Other maternal infectious and parasitic diseases complicating pregnancy, unspecified trimester: Secondary | ICD-10-CM | POA: Insufficient documentation

## 2013-05-03 DIAGNOSIS — J020 Streptococcal pharyngitis: Secondary | ICD-10-CM

## 2013-05-03 DIAGNOSIS — R599 Enlarged lymph nodes, unspecified: Secondary | ICD-10-CM | POA: Insufficient documentation

## 2013-05-03 DIAGNOSIS — Z8679 Personal history of other diseases of the circulatory system: Secondary | ICD-10-CM | POA: Insufficient documentation

## 2013-05-03 DIAGNOSIS — O9989 Other specified diseases and conditions complicating pregnancy, childbirth and the puerperium: Secondary | ICD-10-CM | POA: Insufficient documentation

## 2013-05-03 DIAGNOSIS — Z862 Personal history of diseases of the blood and blood-forming organs and certain disorders involving the immune mechanism: Secondary | ICD-10-CM | POA: Insufficient documentation

## 2013-05-03 MED ORDER — LIDOCAINE VISCOUS 2 % MT SOLN
15.0000 mL | Freq: Once | OROMUCOSAL | Status: AC
  Administered 2013-05-03: 15 mL via OROMUCOSAL
  Filled 2013-05-03: qty 15

## 2013-05-03 MED ORDER — DEXAMETHASONE SODIUM PHOSPHATE 10 MG/ML IJ SOLN
10.0000 mg | Freq: Once | INTRAMUSCULAR | Status: AC
  Administered 2013-05-03: 10 mg via INTRAVENOUS
  Filled 2013-05-03: qty 1

## 2013-05-03 MED ORDER — KETOROLAC TROMETHAMINE 30 MG/ML IJ SOLN
30.0000 mg | Freq: Once | INTRAMUSCULAR | Status: AC
  Administered 2013-05-03: 30 mg via INTRAVENOUS
  Filled 2013-05-03: qty 1

## 2013-05-03 MED ORDER — OXYCODONE-ACETAMINOPHEN 5-325 MG PO TABS: 1.0000 | ORAL_TABLET | Freq: Once | ORAL | Status: DC

## 2013-05-03 MED ORDER — SODIUM CHLORIDE 0.9 % IV BOLUS (SEPSIS)
1000.0000 mL | Freq: Once | INTRAVENOUS | Status: AC
  Administered 2013-05-03: 1000 mL via INTRAVENOUS

## 2013-05-03 MED ORDER — HYDROMORPHONE HCL PF 1 MG/ML IJ SOLN
1.0000 mg | Freq: Once | INTRAMUSCULAR | Status: AC
  Administered 2013-05-03: 1 mg via INTRAMUSCULAR
  Filled 2013-05-03: qty 1

## 2013-05-03 MED ORDER — PENICILLIN G BENZATHINE 1200000 UNIT/2ML IM SUSP
1.2000 10*6.[IU] | Freq: Once | INTRAMUSCULAR | Status: AC
  Administered 2013-05-03: 1.2 10*6.[IU] via INTRAMUSCULAR
  Filled 2013-05-03: qty 2

## 2013-05-03 MED ORDER — SODIUM CHLORIDE 0.9 % IV SOLN
INTRAVENOUS | Status: DC
  Administered 2013-05-03: 14:00:00 via INTRAVENOUS

## 2013-05-03 MED ORDER — LIDOCAINE VISCOUS 2 % MT SOLN: 15.0000 mL | OROMUCOSAL | Status: DC | PRN

## 2013-05-03 NOTE — ED Provider Notes (Signed)
  This was a shared visit with a mid-level provided (NP or PA).  Throughout the patient's course I was available for consultation/collaboration.  I saw the ECG (if appropriate), relevant labs and studies - I agree with the interpretation.  On my exam the patient was in no distress.  She had a notably erythematous posterior oropharynx, though with a patent airway, no asymmetry, and no evidence of imminent airway compromise.       Gerhard Munch, MD 05/03/13 [phone]

## 2013-05-03 NOTE — ED Provider Notes (Signed)
History     CSN: [id number]  Arrival date & time 05/03/13  1143   First MD Initiated Contact with Patient 05/03/13 1502      Chief Complaint  Patient presents with  . Fever  . Sore Throat    (Consider location/radiation/quality/duration/timing/severity/associated sxs/prior treatment) HPI Comments: Patient presents today with a sore throat.  She reports that her throat has been sore for the past week, but worsened over the past 3 days.  She is able to swallow, but has increased pain with swallowing.  She reports that she just noticed the fever today.  She denies any difficulty breathing.  She was seen at [facility] earlier today and transferred to the ED for concern for possible Peritonsillar Abscess.  She was given a dose of Dilaudid prior to transfer.  She has not taken anything for her pain at home prior to arrival.    Patient is a 37 y.o. female presenting with pharyngitis. The history is provided by the patient.  Sore Throat Associated symptoms include a fever, a sore throat and swollen glands. Pertinent negatives include no abdominal pain, chest pain, coughing, nausea, rash or vomiting.    Past Medical History  Diagnosis Date  . Migraines   . Thyroid nodule     removed    Past Surgical History  Procedure Laterality Date  . Thyroid cyst excision    . Foot surgery      Family History  Problem Relation Age of Onset  . Anesthesia problems Neg Hx   . Diabetes Mother     History  Substance Use Topics  . Smoking status: Never Smoker   . Smokeless tobacco: Not on file  . Alcohol Use: No    OB History   Grav Para Term Preterm Abortions TAB SAB Ect Mult Living   8 6 5 1 1 1  0 0 0 6      Review of Systems  Constitutional: Positive for fever.  HENT: Positive for sore throat.   Respiratory: Negative for cough.   Cardiovascular: Negative for chest pain.  Gastrointestinal: Negative for nausea, vomiting and abdominal pain.  Skin: Negative for rash.  All other  systems reviewed and are negative.    Allergies  Benadryl and Iohexol  Home Medications   Current Outpatient Rx  Name  Route  Sig  Dispense  Refill  . IRON PO   Oral   Take 1 tablet by mouth every other day.         . oxyCODONE-acetaminophen (PERCOCET/ROXICET) 5-325 MG per tablet   Oral   Take 1 tablet by mouth every 4 (four) hours as needed for pain (migraine).         . Prenatal Vit-Fe Fumarate-FA (PRENATAL MULTIVITAMIN) TABS   Oral   Take 1 tablet by mouth daily at 12 noon.           BP 110/62  Pulse 106  Temp(Src) 99.5 F (37.5 C) (Oral)  Resp 13  Ht 5\' 7"  (1.702 m)  Wt 157 lb 3.2 oz (71.305 kg)  BMI 24.62 kg/m2  SpO2 98%  Physical Exam  Nursing note and vitals reviewed. Constitutional: She appears well-developed and well-nourished.  HENT:  Head: Normocephalic and atraumatic. No trismus in the jaw.  Right Ear: Tympanic membrane and ear canal normal.  Left Ear: Tympanic membrane and ear canal normal.  Nose: Nose normal.  Mouth/Throat: Uvula is midline. Oropharyngeal exudate, posterior oropharyngeal edema and posterior oropharyngeal erythema present.  Airway patent No drooling.  Handling  secretions well Normal voice phonation.   Tonsils enlarged bilaterally, slightly worse on the right.  Uvula midline.  Neck: Normal range of motion. Neck supple.  Cardiovascular: Normal rate, regular rhythm and normal heart sounds.   Pulmonary/Chest: Effort normal and breath sounds normal.  Lymphadenopathy:    She has cervical adenopathy.  Neurological: She is alert.  Skin: Skin is warm and dry.  Psychiatric: She has a normal mood and affect.    ED Course  Procedures (including critical care time)  Labs Reviewed  RAPID STREP SCREEN   No results found.   1. Tonsillar abscess     Patient discussed with and also evaluated by Dr. Jeraldine Loots.  Patient able to eat and drink without difficulty while in the ED.  MDM  Patient transferred to the ED today from  [facility] due to concern for possible Peritonsillar Abscess.  Uvula midline.  Patient handling secretions well.  No drooling.  No difficulty breathing.  Therefore, do not feel that a Peritonsillar Abscess is present at this time.  Rapid strep positive.  Patient treated with Bicillin in the ED.  Patient also given Decadron to help with the swelling.  Feel that the patient is stable for discharge.  Strict return precautions given.        Pascal Lux [place], PA-C 05/03/13 1819

## 2013-05-03 NOTE — MAU Note (Signed)
Seen a the [facility] this AM was diagnosed with abscessed tonsil and sent here for treatment; T- 100.1 and pt is under a coat and a blanklet; rates pain at 10;

## 2013-05-03 NOTE — ED Notes (Addendum)
Pt reports to the ED for eval of peritonsillar abscess from [facility]. Pt reports left sided pain in her throat 10/10. Pt has had fevers and sore throat. Pt reports difficulty swallowing and pain with swallowing. Airway intact and pt denies any SOB. Pt received 1 mg of Dilaudid PTA. Pt A&O x4. Pt able to speak in full sentences. V/S stable en route. Pt sinus tach at a rate of 100-110s. Pt is [redacted] weeks pregnant. Per [facility] and pt [facility] checked baby and stated that everything was ok.

## 2013-05-03 NOTE — MAU Note (Signed)
Patient states she has had a sore throat on the left side for 3 days, pain with talking and swollowing. Went to the [facility] and states she does not have strep throat but does have abscess on the tonsil on the left side. Had a fever of 102 and sent to MAU because she is pregnant. No pregnancy problems.

## 2013-05-03 NOTE — MAU Provider Note (Signed)
History     CSN: [id number]  Arrival date and time: 05/03/13 1143   First Provider Initiated Contact with Patient 05/03/13 1246      Chief Complaint  Patient presents with  . Fever  . Sore Throat   HPI Ms. [name] is a 37 y.o. [id number] at [redacted]w[redacted]d who was sent to MAU today from [facility] with a tonsillar abscess. The patient had fever of 102F at [facility]. She states sore throat x 3 days. She has had difficulty swallowing and talking. She denies other URI symptoms, abdominal pain, vaginal bleeding or discharge. She does have a headache.    OB History   Grav Para Term Preterm Abortions TAB SAB Ect Mult Living   8 6 5 1 1 1  0 0 0 6      Past Medical History  Diagnosis Date  . Migraines   . Thyroid nodule     removed    Past Surgical History  Procedure Laterality Date  . Thyroid cyst excision    . Foot surgery      Family History  Problem Relation Age of Onset  . Anesthesia problems Neg Hx   . Diabetes Mother     History  Substance Use Topics  . Smoking status: Never Smoker   . Smokeless tobacco: Not on file  . Alcohol Use: No    Allergies:  Allergies  Allergen Reactions  . Iohexol      Desc: PT EXPERIENCE NUMBNESS OF TONGUE AND THOAT IMMEDIATELY AFTER INJECTION. NO SOB. W/IN 10 MINS, NUMBNESS MOVED UP INTO FACE. PT WAS SEEN IN CT BY RICK MILLER, PA AND WAS TAKEN TO ER BY ME FOR FURTHER EVALUATION BY DR. Bebe Shaggy FOR CONTRAST REACTION. TSF, Onset Date: [id number]     Prescriptions prior to admission  Medication Sig Dispense Refill  . Prenatal Vit-Fe Fumarate-FA (MULTIVITAMIN-PRENATAL) 27-0.8 MG TABS Take 1 tablet by mouth daily.        Review of Systems  Constitutional: Positive for fever and malaise/fatigue.  HENT: Positive for sore throat. Negative for congestion.   Respiratory: Negative for cough and shortness of breath.   Gastrointestinal: Negative for nausea, vomiting and abdominal pain.  Genitourinary: Negative for dysuria, urgency  and frequency.       Neg - vaginal bleeding, discharge  Neurological: Positive for headaches. Negative for dizziness, loss of consciousness and weakness.   Physical Exam   Blood pressure 108/65, pulse 112, temperature 100.1 F (37.8 C), temperature source Oral, resp. rate 16, height 5\' 7"  (1.702 m), weight 157 lb 3.2 oz (71.305 kg), SpO2 99.00%.  Physical Exam  Constitutional: She is oriented to person, place, and time. She appears well-developed and well-nourished. No distress.  HENT:  Head: Normocephalic and atraumatic.  Right Ear: No drainage or swelling. Tympanic membrane is not injected, not erythematous and not bulging.  Left Ear: No drainage or swelling. Tympanic membrane is not injected, not erythematous and not bulging.  Nose: Mucosal edema present. No rhinorrhea or sinus tenderness. Right sinus exhibits no maxillary sinus tenderness and no frontal sinus tenderness. Left sinus exhibits no maxillary sinus tenderness and no frontal sinus tenderness.  Mouth/Throat: Normal dentition. Oropharyngeal exudate, posterior oropharyngeal edema and tonsillar abscesses present.  Cardiovascular: Normal rate, regular rhythm and normal heart sounds.   Respiratory: Effort normal and breath sounds normal. No respiratory distress.  Lymphadenopathy:       Head (right side): Submandibular and tonsillar adenopathy present. No submental, no preauricular, no posterior auricular and no occipital  adenopathy present.       Head (left side): Submandibular and tonsillar adenopathy present. No submental, no preauricular, no posterior auricular and no occipital adenopathy present.    She has cervical adenopathy.  Neurological: She is alert and oriented to person, place, and time.  Skin: Skin is warm and dry. No erythema.  Psychiatric: She has a normal mood and affect.    MAU Course  Procedures None  MDM Discussed with Dr. Thana Ates. Transfer to [facility] for ENT consult and evaluation.  Ok to give IM Dilaudid for  pain to keep patient NPO at this time Assessment and Plan  A: Tonsillar abscess  P: Transfer to [facility] for evaluation by ENT and most likely surgery  Freddi Starr, PA-C  05/03/2013, 12:46 PM

## 2013-05-06 ENCOUNTER — Encounter (HOSPITAL_COMMUNITY): Payer: Self-pay | Admitting: Emergency Medicine

## 2013-05-06 ENCOUNTER — Emergency Department (HOSPITAL_COMMUNITY)
Admission: EM | Admit: 2013-05-06 | Discharge: 2013-05-06 | Disposition: A | Discharge status: Home / Self Care | Payer: 59 | Attending: Emergency Medicine | Admitting: Emergency Medicine

## 2013-05-06 DIAGNOSIS — Z79899 Other long term (current) drug therapy: Secondary | ICD-10-CM | POA: Insufficient documentation

## 2013-05-06 DIAGNOSIS — J039 Acute tonsillitis, unspecified: Secondary | ICD-10-CM | POA: Insufficient documentation

## 2013-05-06 DIAGNOSIS — O9989 Other specified diseases and conditions complicating pregnancy, childbirth and the puerperium: Secondary | ICD-10-CM | POA: Insufficient documentation

## 2013-05-06 DIAGNOSIS — J029 Acute pharyngitis, unspecified: Secondary | ICD-10-CM | POA: Insufficient documentation

## 2013-05-06 DIAGNOSIS — R6883 Chills (without fever): Secondary | ICD-10-CM | POA: Insufficient documentation

## 2013-05-06 DIAGNOSIS — Z8639 Personal history of other endocrine, nutritional and metabolic disease: Secondary | ICD-10-CM | POA: Insufficient documentation

## 2013-05-06 DIAGNOSIS — R1310 Dysphagia, unspecified: Secondary | ICD-10-CM | POA: Insufficient documentation

## 2013-05-06 DIAGNOSIS — R599 Enlarged lymph nodes, unspecified: Secondary | ICD-10-CM | POA: Insufficient documentation

## 2013-05-06 DIAGNOSIS — Z8679 Personal history of other diseases of the circulatory system: Secondary | ICD-10-CM | POA: Insufficient documentation

## 2013-05-06 DIAGNOSIS — Z862 Personal history of diseases of the blood and blood-forming organs and certain disorders involving the immune mechanism: Secondary | ICD-10-CM | POA: Insufficient documentation

## 2013-05-06 MED ORDER — GI COCKTAIL ~~LOC~~: 30.0000 mL | Freq: Once | ORAL | Status: DC

## 2013-05-06 MED ORDER — HYDROCODONE-ACETAMINOPHEN 7.5-325 MG/15ML PO SOLN: 15.0000 mL | Freq: Three times a day (TID) | ORAL | Status: DC | PRN

## 2013-05-06 MED ORDER — GI COCKTAIL ~~LOC~~
30.0000 mL | Freq: Once | ORAL | Status: DC
  Administered 2013-05-06: 30 mL via ORAL
  Filled 2013-05-06: qty 30

## 2013-05-06 MED ORDER — AMOXICILLIN 500 MG PO CAPS: 500.0000 mg | ORAL_CAPSULE | Freq: Two times a day (BID) | ORAL | Status: DC

## 2013-05-06 MED ORDER — LIDOCAINE VISCOUS 2 % MT SOLN: 15.0000 mL | OROMUCOSAL | Status: DC | PRN

## 2013-05-06 NOTE — ED Notes (Signed)
FHT 144 found lower abd middle

## 2013-05-06 NOTE — ED Provider Notes (Signed)
37 year old female presents with a complaint of a sore throat coming from [facility] hospital after being told she may have an abscess. On my exam the patient has mild lymphadenopathy, no trismus, no torticollis, able to open her mouth wide and has moist mucous membranes. Her posterior pharynx is completely visualized without any peritonsillar abscesses or swelling, no asymmetry, no uvular deviation. On my exam the uvula is able to be moved around with the tongue depressor without any difficulty, there is no palatal swelling, and voice and phonation is normal. The patient appears stable, she can be treated for a pharyngitis, followup with family doctor.   Medical screening examination/treatment/procedure(s) were conducted as a shared visit with non-physician practitioner(s) and myself.  I personally evaluated the patient during the encounter    Vida Roller, MD 05/06/13 1055

## 2013-05-06 NOTE — ED Notes (Addendum)
Pt is 12 week ob , lmp 03/07/13 Pt was seen for  Peri tonsillar abcess rt side of throat on 6/20. Pt states is no better still  sorethroat and hurts to swollow

## 2013-05-06 NOTE — ED Notes (Signed)
Is G 8 P 6 A 1 L6

## 2013-05-06 NOTE — ED Notes (Signed)
Given directions as to room temp liquids , soft foods and to check w/ ob gyn about being able to take motrin if she does not want to be so sleepy w/ vicodin liquid

## 2013-05-06 NOTE — ED Provider Notes (Signed)
History     CSN: [id number]  Arrival date & time 05/06/13  0840   First MD Initiated Contact with Patient 05/06/13 [phone]      Chief Complaint  Patient presents with  . Sore Throat    (Consider location/radiation/quality/duration/timing/severity/associated sxs/prior treatment) HPI Comments: Patient is a 37 year old female, who endorses being [redacted] weeks pregnant, who presents for sore throat x5 days. Patient was seen on 05/03/2013 for sore throat and was found to have a positive rapid strep test; uvula at this time was midline with patent airway. She was treated in the ED with a shot of bacillin and discharged with instructions for symptomatic management with viscous lidocaine and Tylenol. Patient returned to the ED today she states her sore throat has not been improving. She denies any worsening of symptoms, but states that sore throat is still aggravated with swallowing secondary to pain. She also states that the viscous lidocaine has not been improving her sore throat symptoms. She denies fevers at home, but admits to intermittent associated chills. She further denies ear pain/discharge, nasal congestion, rhinorrhea, drooling, inability to swallow secretions, neck pain or stiffness, shortness of breath, and N/V as well as complicating features of pregnancy including abdominal pain, vaginal d/c, and vaginal bleeding.   Patient is a 37 y.o. female presenting with pharyngitis. The history is provided by the patient. No language interpreter was used.  Sore Throat Associated symptoms include chills and a sore throat. Pertinent negatives include no abdominal pain, chest pain, congestion, coughing, fever, nausea, neck pain or vomiting.    Past Medical History  Diagnosis Date  . Migraines   . Thyroid nodule     removed    Past Surgical History  Procedure Laterality Date  . Thyroid cyst excision    . Foot surgery      Family History  Problem Relation Age of Onset  . Anesthesia problems Neg  Hx   . Diabetes Mother     History  Substance Use Topics  . Smoking status: Never Smoker   . Smokeless tobacco: Not on file  . Alcohol Use: No    OB History   Grav Para Term Preterm Abortions TAB SAB Ect Mult Living   8 6 5 1 1 1  0 0 0 6      Review of Systems  Constitutional: Positive for chills. Negative for fever.  HENT: Positive for sore throat. Negative for congestion, rhinorrhea, drooling, trouble swallowing, neck pain, neck stiffness and ear discharge.   Respiratory: Negative for cough and shortness of breath.   Cardiovascular: Negative for chest pain.  Gastrointestinal: Negative for nausea, vomiting and abdominal pain.  Genitourinary: Negative for vaginal bleeding, vaginal discharge and pelvic pain.  All other systems reviewed and are negative.    Allergies  Benadryl and Iohexol  Home Medications   Current Outpatient Rx  Name  Route  Sig  Dispense  Refill  . acetaminophen (TYLENOL) 500 MG tablet   Oral   Take 1,000 mg by mouth every 6 (six) hours as needed for pain.         Marland Kitchen amoxicillin (AMOXIL) 500 MG capsule   Oral   Take 1 capsule (500 mg total) by mouth 2 (two) times daily.   20 capsule   0   . IRON PO   Oral   Take 1 tablet by mouth every other day.         . lidocaine (XYLOCAINE) 2 % solution   Oral   Take 15 mLs by  mouth as needed for pain.   100 mL   0   . oxyCODONE-acetaminophen (PERCOCET/ROXICET) 5-325 MG per tablet   Oral   Take 1 tablet by mouth every 4 (four) hours as needed for pain (migraine).         . Prenatal Vit-Fe Fumarate-FA (PRENATAL MULTIVITAMIN) TABS   Oral   Take 1 tablet by mouth daily at 12 noon.           BP 107/56  Pulse 78  Temp(Src) 98 F (36.7 C) (Oral)  SpO2 100%  Physical Exam  Nursing note and vitals reviewed. Constitutional: She is oriented to person, place, and time. She appears well-developed and well-nourished. No distress.  HENT:  Head: Normocephalic and atraumatic. No trismus in the  jaw.  Right Ear: Tympanic membrane, external ear and ear canal normal. No mastoid tenderness.  Left Ear: Tympanic membrane, external ear and ear canal normal. No mastoid tenderness.  Nose: Nose normal.  Mouth/Throat: Mucous membranes are normal. No oral lesions. Posterior oropharyngeal erythema present. No oropharyngeal exudate or posterior oropharyngeal edema.  +b/l tonsillar erythema and swelling without exudates. Uvula midline. Posterior oropharynx erythematous without edema. Airway patent and patient tolerating secretions without difficulty.  Eyes: Conjunctivae and EOM are normal. Pupils are equal, round, and reactive to light. No scleral icterus.  Neck: Normal range of motion. Neck supple.  No stridor. Airway patent.  Cardiovascular: Normal rate, regular rhythm and normal heart sounds.   Pulmonary/Chest: Effort normal and breath sounds normal. No stridor. No respiratory distress. She has no wheezes. She has no rales.  Musculoskeletal: Normal range of motion.  Lymphadenopathy:    She has cervical adenopathy.  Neurological: She is alert and oriented to person, place, and time.  Skin: Skin is warm and dry. No rash noted. She is not diaphoretic. No erythema. No pallor.  Psychiatric: She has a normal mood and affect. Her behavior is normal.    ED Course  Procedures (including critical care time)  Labs Reviewed  PREGNANCY, URINE   No results found.   1. Acute tonsillitis     MDM  37 year old pregnant female who presents for continued sore throat. Physical exam with bilateral tonsillar erythema and swelling without exudates. Uvula midline and airway patent. There is no stridor. No evidence of peritonsillar abscess. Do not believe further workup or imaging is warranted at this time. Patient given Bicillin IM at ED visit on 05/03/13, but denies significant relief of symptoms. Patient will be discharged with prescription for amoxicillin for tonsillitis as well as viscous lidocaine for sore  throat. Saltwater gargles and Tylenol advised for additional symptomatic improvement. Patient instructed to follow up with her primary care provider and indications for ED return discussed. Patient states comfort and understanding with plan. Patient examined also by Dr. Hyacinth Meeker who is in agreement with this work up and management plan.        Antony Madura, PA-C 05/06/13 1101

## 2013-05-06 NOTE — ED Provider Notes (Signed)
Medical screening examination/treatment/procedure(s) were conducted as a shared visit with non-physician practitioner(s) and myself.  I personally evaluated the patient during the encounter  Please see my separate respective documentation pertaining to this patient encounter   Vida Roller, MD 05/06/13 2059

## 2013-09-19 ENCOUNTER — Other Ambulatory Visit: Payer: Self-pay

## 2013-10-17 LAB — OB RESULTS CONSOLE GBS: GBS: POSITIVE

## 2013-11-11 ENCOUNTER — Inpatient Hospital Stay (HOSPITAL_COMMUNITY)
Admission: AD | Admit: 2013-11-11 | Discharge: 2013-11-13 | DRG: 775 | Disposition: A | Discharge status: Home / Self Care | Payer: 59 | Source: Ambulatory Visit | Attending: Obstetrics and Gynecology | Admitting: Obstetrics and Gynecology

## 2013-11-11 ENCOUNTER — Encounter (HOSPITAL_COMMUNITY): Payer: Self-pay | Admitting: *Deleted

## 2013-11-11 DIAGNOSIS — O99892 Other specified diseases and conditions complicating childbirth: Principal | ICD-10-CM | POA: Diagnosis present

## 2013-11-11 DIAGNOSIS — Z22330 Carrier of Group B streptococcus: Secondary | ICD-10-CM

## 2013-11-11 DIAGNOSIS — IMO0001 Reserved for inherently not codable concepts without codable children: Secondary | ICD-10-CM

## 2013-11-11 DIAGNOSIS — O09529 Supervision of elderly multigravida, unspecified trimester: Secondary | ICD-10-CM | POA: Diagnosis present

## 2013-11-11 LAB — CBC
Hemoglobin: 13.1 g/dL (ref 12.0–15.0)
MCH: 26 pg (ref 26.0–34.0)
MCV: 78.5 fL (ref 78.0–100.0)
Platelets: 169 10*3/uL (ref 150–400)
RBC: 5.03 MIL/uL (ref 3.87–5.11)
WBC: 13 10*3/uL — ABNORMAL HIGH (ref 4.0–10.5)

## 2013-11-11 LAB — RPR: RPR Ser Ql: NONREACTIVE

## 2013-11-11 LAB — ABO/RH: ABO/RH(D): O POS

## 2013-11-11 MED ORDER — ACETAMINOPHEN 325 MG PO TABS: 650.0000 mg | ORAL_TABLET | ORAL | Status: DC | PRN

## 2013-11-11 MED ORDER — ONDANSETRON HCL 4 MG PO TABS: 4.0000 mg | ORAL_TABLET | ORAL | Status: DC | PRN

## 2013-11-11 MED ORDER — IBUPROFEN 600 MG PO TABS
600.0000 mg | ORAL_TABLET | Freq: Four times a day (QID) | ORAL | Status: DC | PRN
  Filled 2013-11-11: qty 1

## 2013-11-11 MED ORDER — ONDANSETRON HCL 4 MG/2ML IJ SOLN: 4.0000 mg | INTRAMUSCULAR | Status: DC | PRN

## 2013-11-11 MED ORDER — PRENATAL MULTIVITAMIN CH
1.0000 | ORAL_TABLET | Freq: Every day | ORAL | Status: DC
  Administered 2013-11-12 – 2013-11-13 (×2): 1 via ORAL
  Filled 2013-11-11 (×2): qty 1

## 2013-11-11 MED ORDER — OXYCODONE-ACETAMINOPHEN 5-325 MG PO TABS
1.0000 | ORAL_TABLET | ORAL | Status: DC | PRN
  Administered 2013-11-11: 1 via ORAL
  Administered 2013-11-11: 2 via ORAL
  Administered 2013-11-12 (×2): 1 via ORAL
  Filled 2013-11-11 (×3): qty 1

## 2013-11-11 MED ORDER — OXYTOCIN 40 UNITS IN LACTATED RINGERS INFUSION - SIMPLE MED
62.5000 mL/h | INTRAVENOUS | Status: DC
  Filled 2013-11-11: qty 1000

## 2013-11-11 MED ORDER — OXYCODONE-ACETAMINOPHEN 5-325 MG PO TABS
1.0000 | ORAL_TABLET | ORAL | Status: DC | PRN
  Filled 2013-11-11: qty 2

## 2013-11-11 MED ORDER — CITRIC ACID-SODIUM CITRATE 334-500 MG/5ML PO SOLN: 30.0000 mL | ORAL | Status: DC | PRN

## 2013-11-11 MED ORDER — LIDOCAINE HCL (PF) 1 % IJ SOLN
30.0000 mL | INTRAMUSCULAR | Status: DC | PRN
  Filled 2013-11-11: qty 30

## 2013-11-11 MED ORDER — LACTATED RINGERS IV SOLN: 500.0000 mL | INTRAVENOUS | Status: DC | PRN

## 2013-11-11 MED ORDER — DIBUCAINE 1 % RE OINT
1.0000 "application " | TOPICAL_OINTMENT | RECTAL | Status: DC | PRN
  Filled 2013-11-11: qty 28

## 2013-11-11 MED ORDER — SIMETHICONE 80 MG PO CHEW: 80.0000 mg | CHEWABLE_TABLET | ORAL | Status: DC | PRN

## 2013-11-11 MED ORDER — IBUPROFEN 600 MG PO TABS
600.0000 mg | ORAL_TABLET | Freq: Four times a day (QID) | ORAL | Status: DC
  Administered 2013-11-11 – 2013-11-13 (×8): 600 mg via ORAL
  Filled 2013-11-11 (×7): qty 1

## 2013-11-11 MED ORDER — SENNOSIDES-DOCUSATE SODIUM 8.6-50 MG PO TABS
2.0000 | ORAL_TABLET | ORAL | Status: DC
  Administered 2013-11-11 – 2013-11-12 (×2): 2 via ORAL
  Filled 2013-11-11 (×2): qty 2

## 2013-11-11 MED ORDER — LACTATED RINGERS IV SOLN
INTRAVENOUS | Status: DC
  Administered 2013-11-11: 17:00:00 via INTRAVENOUS

## 2013-11-11 MED ORDER — LANOLIN HYDROUS EX OINT: TOPICAL_OINTMENT | CUTANEOUS | Status: DC | PRN

## 2013-11-11 MED ORDER — TETANUS-DIPHTH-ACELL PERTUSSIS 5-2.5-18.5 LF-MCG/0.5 IM SUSP: 0.5000 mL | Freq: Once | INTRAMUSCULAR | Status: DC

## 2013-11-11 MED ORDER — MEASLES, MUMPS & RUBELLA VAC ~~LOC~~ INJ: 0.5000 mL | INJECTION | Freq: Once | SUBCUTANEOUS | Status: DC

## 2013-11-11 MED ORDER — OXYTOCIN 10 UNIT/ML IJ SOLN
INTRAMUSCULAR | Status: AC
  Administered 2013-11-11: 10 [IU] via INTRAMUSCULAR
  Filled 2013-11-11: qty 1

## 2013-11-11 MED ORDER — MEDROXYPROGESTERONE ACETATE 150 MG/ML IM SUSP: 150.0000 mg | INTRAMUSCULAR | Status: DC | PRN

## 2013-11-11 MED ORDER — ONDANSETRON HCL 4 MG/2ML IJ SOLN: 4.0000 mg | Freq: Four times a day (QID) | INTRAMUSCULAR | Status: DC | PRN

## 2013-11-11 MED ORDER — WITCH HAZEL-GLYCERIN EX PADS: 1.0000 "application " | MEDICATED_PAD | CUTANEOUS | Status: DC | PRN

## 2013-11-11 MED ORDER — OXYTOCIN BOLUS FROM INFUSION: 500.0000 mL | INTRAVENOUS | Status: DC

## 2013-11-11 MED ORDER — BENZOCAINE-MENTHOL 20-0.5 % EX AERO: 1.0000 "application " | INHALATION_SPRAY | CUTANEOUS | Status: DC | PRN

## 2013-11-11 NOTE — MAU Note (Signed)
L&D will triage pt for [place]

## 2013-11-11 NOTE — H&P (Signed)
[name] is a 37 y.o. female presenting for labor.  Complaining of painful contractions q3-38min.  No lof or vb.  Pt initial exam 3cm/thick and posterior.  History OB History   Grav Para Term Preterm Abortions TAB SAB Ect Mult Living   8 6 5 1 1 1  0 0 0 6     Past Medical History  Diagnosis Date  . Migraines   . Thyroid nodule     removed   Past Surgical History  Procedure Laterality Date  . Thyroid cyst excision    . Foot surgery     Family History: family history includes Diabetes in her mother. There is no history of Anesthesia problems. Social History:  reports that she has never smoked. She does not have any smokeless tobacco history on file. She reports that she does not drink alcohol or use illicit drugs.   Prenatal Transfer Tool  Maternal Diabetes: No Genetic Screening: Normal Maternal Ultrasounds/Referrals: Normal Fetal Ultrasounds or other Referrals:  None Maternal Substance Abuse:  No Significant Maternal Medications:  None Significant Maternal Lab Results:  None Other Comments:  None  ROS  Dilation: 3.5 Effacement (%): 50 Station: -3 Exam by:: Lorretta Harp RNC Blood pressure 115/58, pulse 76, temperature 98.1 F (36.7 C), temperature source Oral, resp. rate 20, height 5\' 7"  (1.702 m), weight 92.08 kg (203 lb). Exam Physical Exam  Prenatal labs: ABO, Rh:   Antibody:   Rubella:   RPR: Nonreactive (06/16 0000)  HBsAg:    HIV: Non-reactive (06/16 0000)  GBS: Positive (12/04 0000)   Assessment/Plan: Labor    [name] 11/11/2013, 5:34 PM

## 2013-11-11 NOTE — Progress Notes (Signed)
Precipitous SVD of vigerous female infant w/ apgars of 8,9.  I was not present for delivery of infant, delivery attended by  Faculty practice resident and attending (dr Shawnie Pons) Upon my arrival, infant vigerous in warmer, placenta in utero I delivered placenta, spontaneous with gentle traction. No lacerations  Fundus firm.  EBL 200cc.

## 2013-11-12 LAB — CBC
Hemoglobin: 11.8 g/dL — ABNORMAL LOW (ref 12.0–15.0)
MCH: 25.9 pg — ABNORMAL LOW (ref 26.0–34.0)
MCHC: 33.2 g/dL (ref 30.0–36.0)
MCV: 77.9 fL — ABNORMAL LOW (ref 78.0–100.0)
Platelets: 162 10*3/uL (ref 150–400)
RDW: 16.4 % — ABNORMAL HIGH (ref 11.5–15.5)

## 2013-11-12 NOTE — Lactation Note (Signed)
This note was copied from the chart of Janet Sana Tessmer. Lactation Consultation Note Initial Consult:  Baby quiet and alert.  Mother of 7 with prior breastfeeding experience denies any pain or need of assistance.  States breastfeeding is going well. Reviewed lactation support services and brochure.  Encouraged mother to call lactation if needed.   Patient Name: [name] [id number] Date: 11/12/2013 Reason for consult: Initial assessment   Maternal Data Infant to breast within first hour of birth: Yes Does the patient have breastfeeding experience prior to this delivery?: Yes  Feeding Feeding Type: Breast Fed Length of feed: 20 min  LATCH Score/Interventions Latch: Grasps breast easily, tongue down, lips flanged, rhythmical sucking.  Audible Swallowing: Spontaneous and intermittent Intervention(s): Hand expression  Type of Nipple: Everted at rest and after stimulation  Comfort (Breast/Nipple): Soft / non-tender     Hold (Positioning): No assistance needed to correctly position infant at breast.  LATCH Score: 10  Lactation Tools Discussed/Used     Consult Status Consult Status: PRN    Dahlia Byes Boschen 11/12/2013, 1:50 PM

## 2013-11-12 NOTE — Progress Notes (Signed)
Post Partum Day 1 Subjective: no complaints, up ad lib, voiding and tolerating PO  Objective: Blood pressure 112/74, pulse 68, temperature 98.7 F (37.1 C), temperature source Oral, resp. rate 18, height 5\' 7"  (1.702 m), weight 203 lb (92.08 kg), SpO2 99.00%, unknown if currently breastfeeding.  Physical Exam:  General: alert, cooperative and appears stated age Lochia: appropriate Uterine Fundus: firm Incision: perineum intact DVT Evaluation: No evidence of DVT seen on physical exam. Negative Homan's sign. No cords or calf tenderness.   Recent Labs  11/11/13 1734 11/12/13 0558  HGB 13.1 11.8*  HCT 39.5 35.5*    Assessment/Plan: Plan for discharge tomorrow   LOS: 1 day   [name] 11/12/2013, 9:10 AM

## 2013-11-13 LAB — TYPE AND SCREEN: Antibody Screen: NEGATIVE

## 2013-11-13 MED ORDER — OXYCODONE-ACETAMINOPHEN 7.5-325 MG PO TABS: 1.0000 | ORAL_TABLET | ORAL | Status: AC | PRN

## 2013-11-13 NOTE — Lactation Note (Signed)
This note was copied from the chart of [name]. Lactation Consultation Note  Patient Name: Janet Glenola Wheat [id number] Date: 11/13/2013 Reason for consult: Follow-up assessment Experienced breastfeeding mother. Had baby latched when arrived to room. She has fed previous babies for 2-6 months and denies any problems. Discussed the benefits of exclusively breastfeeding for 6 months. Breast are heavier and baby content after the feeding. Mother has scheduled an appointment for baby at [facility] in [place] for Monday, Nov 18, 2013. Has contact numbers for [facility] support after discharge.  Maternal Data    Feeding Feeding Type: Breast Fed Length of feed: 15 min  LATCH Score/Interventions Latch: Grasps breast easily, tongue down, lips flanged, rhythmical sucking.  Audible Swallowing: A few with stimulation  Type of Nipple: Everted at rest and after stimulation  Comfort (Breast/Nipple): Soft / non-tender     Hold (Positioning): No assistance needed to correctly position infant at breast.  LATCH Score: 9  Lactation Tools Discussed/Used WIC Program: No Pump Review: Setup, frequency, and cleaning;Milk Storage Initiated by:: Amador Cunas, RN, IBCLC Date initiated:: 11/13/13   Consult Status Consult Status: Complete    Omar Person 11/13/2013, 8:52 AM

## 2013-11-13 NOTE — Discharge Summary (Signed)
Obstetric Discharge Summary Reason for Admission: onset of labor Prenatal Procedures: none Intrapartum Procedures: spontaneous vaginal delivery Postpartum Procedures: none Complications-Operative and Postpartum: none Hemoglobin  Date Value Range Status  11/12/2013 11.8* 12.0 - 15.0 g/dL Final     HCT  Date Value Range Status  11/12/2013 35.5* 36.0 - 46.0 % Final    Physical Exam:  General: alert Lochia: appropriate Uterine Fundus: firm Incision: healing well DVT Evaluation: No evidence of DVT seen on physical exam.  Discharge Diagnoses: Term Pregnancy-delivered  Discharge Information: Date: 11/13/2013 Activity: pelvic rest Diet: routine Medications: PNV and Percocet Condition: stable Instructions: refer to practice specific booklet Discharge to: home   Newborn Data: Live born female  Birth Weight: 8 lb 2.2 oz (3690 g) APGAR: 8, 9  Home with mother.  [name] S 11/13/2013, 8:19 AM

## 2014-09-15 ENCOUNTER — Encounter (HOSPITAL_COMMUNITY): Payer: Self-pay | Admitting: *Deleted

## 2016-05-03 ENCOUNTER — Other Ambulatory Visit (HOSPITAL_COMMUNITY): Payer: Self-pay | Admitting: Family Medicine

## 2016-05-03 DIAGNOSIS — Z1231 Encounter for screening mammogram for malignant neoplasm of breast: Secondary | ICD-10-CM

## 2016-05-12 ENCOUNTER — Ambulatory Visit (HOSPITAL_COMMUNITY): Payer: Self-pay

## 2016-05-12 ENCOUNTER — Ambulatory Visit (HOSPITAL_COMMUNITY)
Admission: RE | Admit: 2016-05-12 | Discharge: 2016-05-12 | Disposition: A | Discharge status: Home / Self Care | Payer: 59 | Source: Ambulatory Visit | Attending: Family Medicine | Admitting: Family Medicine

## 2016-05-12 DIAGNOSIS — Z1231 Encounter for screening mammogram for malignant neoplasm of breast: Secondary | ICD-10-CM | POA: Insufficient documentation

## 2016-05-12 DIAGNOSIS — R928 Other abnormal and inconclusive findings on diagnostic imaging of breast: Secondary | ICD-10-CM | POA: Insufficient documentation

## 2016-05-18 ENCOUNTER — Other Ambulatory Visit: Payer: Self-pay | Admitting: Family Medicine

## 2016-05-18 DIAGNOSIS — R928 Other abnormal and inconclusive findings on diagnostic imaging of breast: Secondary | ICD-10-CM

## 2016-06-17 ENCOUNTER — Ambulatory Visit
Admission: RE | Admit: 2016-06-17 | Discharge: 2016-06-17 | Disposition: A | Discharge status: Home / Self Care | Payer: 59 | Source: Ambulatory Visit | Attending: Family Medicine | Admitting: Family Medicine

## 2016-06-17 DIAGNOSIS — R928 Other abnormal and inconclusive findings on diagnostic imaging of breast: Secondary | ICD-10-CM

## 2016-12-02 IMAGING — MG DIGITAL SCREENING BILATERAL MAMMOGRAM WITH CAD
4 series · 4 of 4 positions shown · non-contrast
Comparison: Previous exam(s).

CLINICAL DATA: Screening.

EXAM:
DIGITAL SCREENING BILATERAL MAMMOGRAM WITH CAD

[R CC]
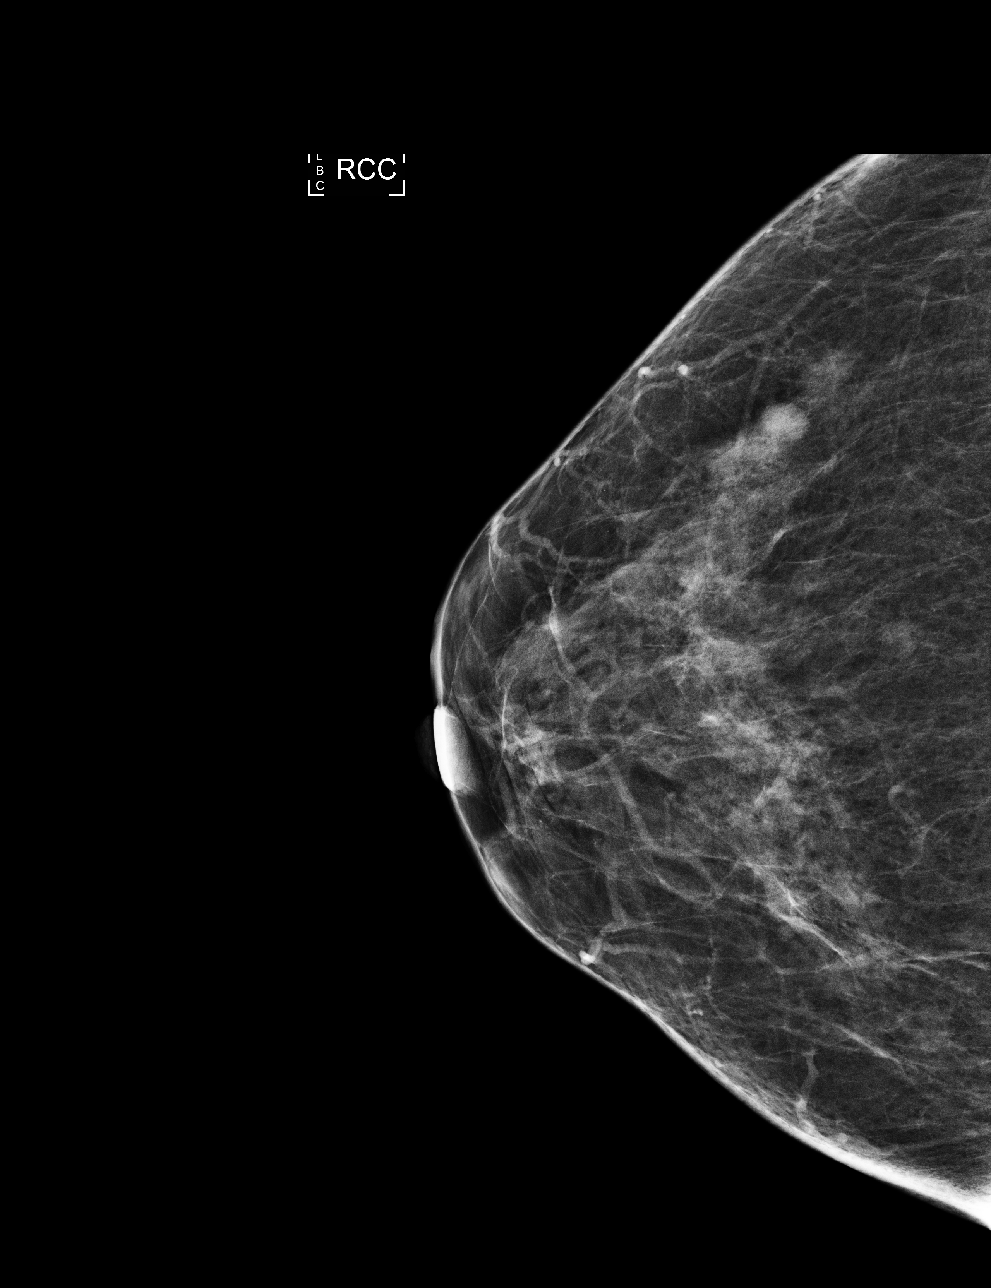

[L MLO]
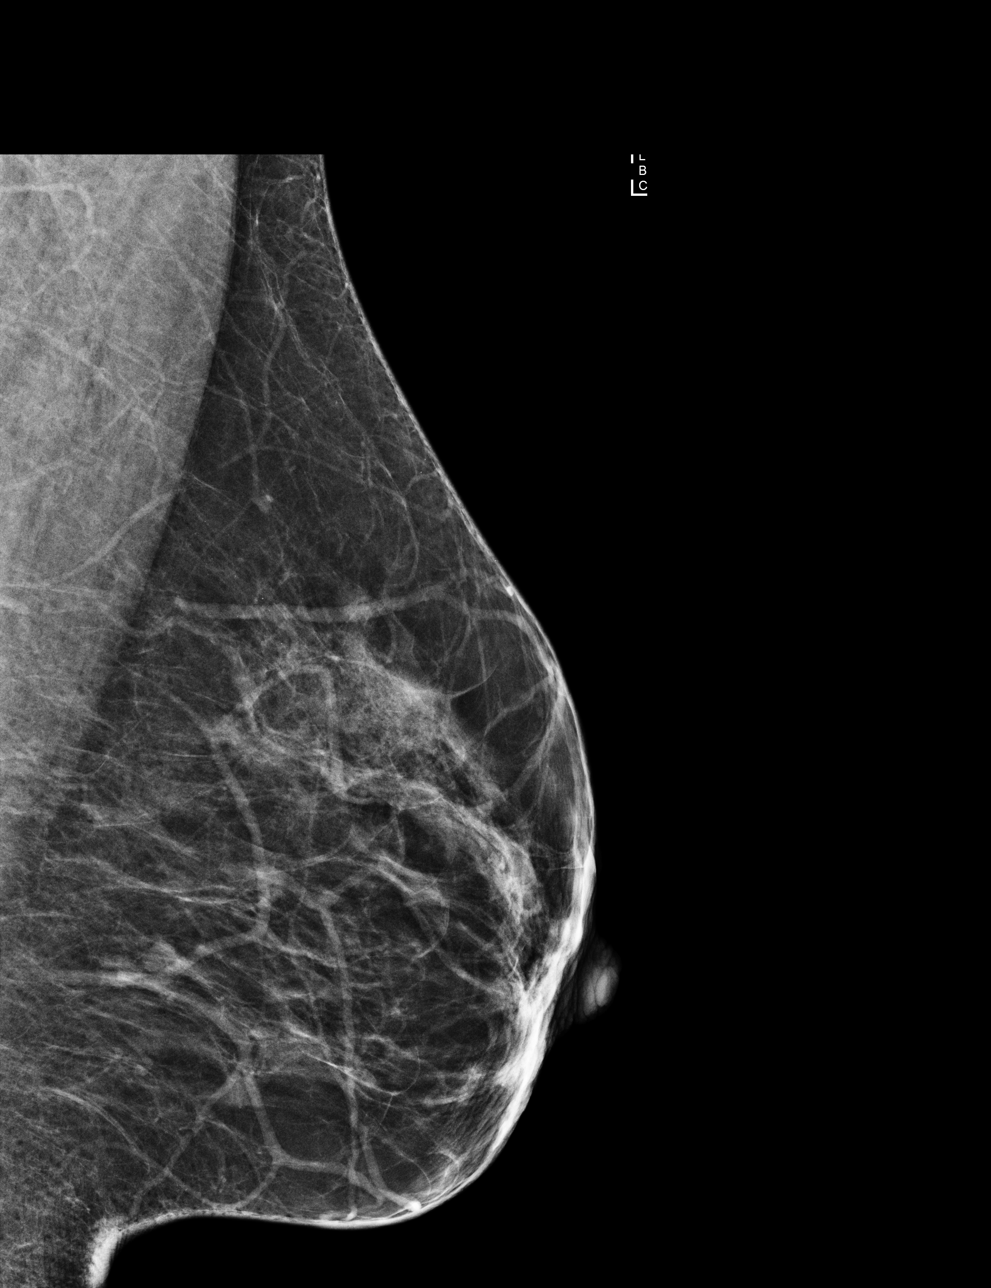

[L CC]
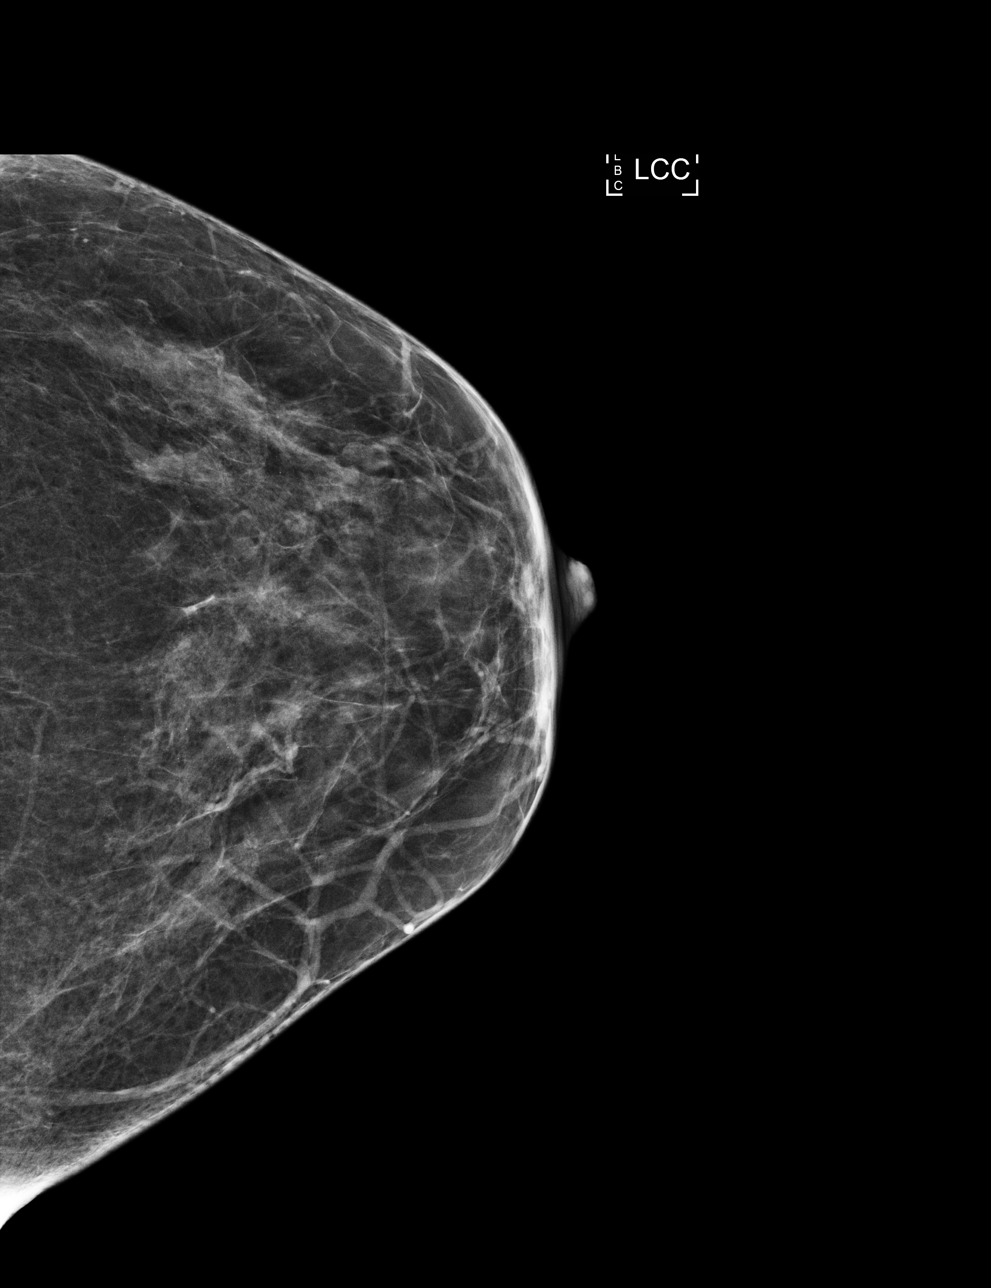

[R MLO]
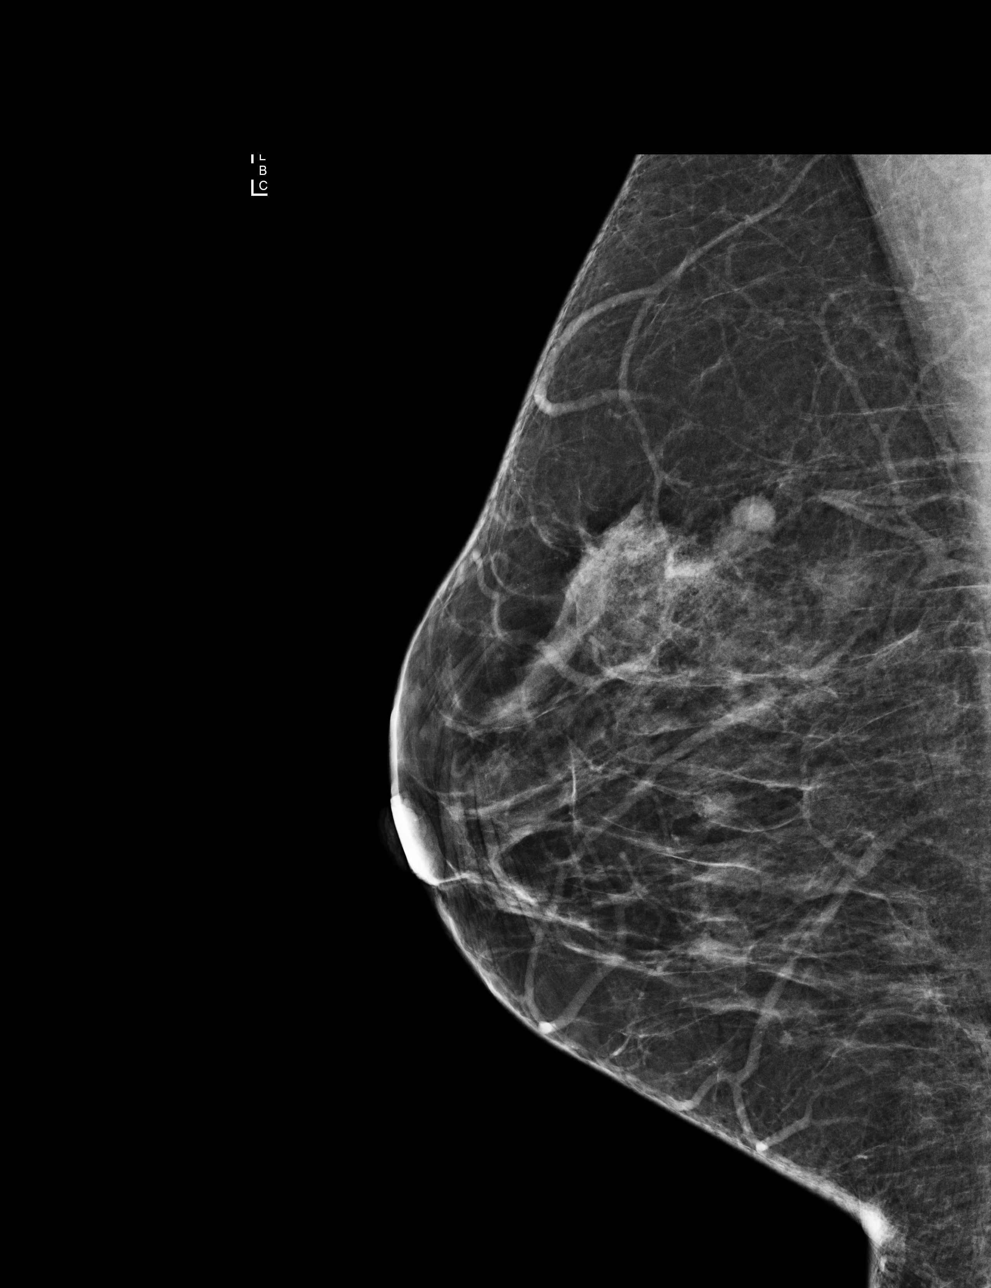

[4 of 4 positions shown; findings below may reference images not displayed]

ACR Breast Density Category b: There are scattered areas of
fibroglandular density.
FINDINGS: In the right breast, a possible mass warrants further evaluation. In
the left breast, no findings suspicious for malignancy. Images were
processed with CAD.
IMPRESSION: Further evaluation is suggested for possible mass in the right
breast.

RECOMMENDATION:
Diagnostic mammogram and possibly ultrasound of the right breast.
(Code:[id number])

The patient will be contacted regarding the findings, and additional
imaging will be scheduled.

BI-RADS CATEGORY  0: Incomplete. Need additional imaging evaluation
and/or prior mammograms for comparison.

## 2017-01-04 DIAGNOSIS — L03211 Cellulitis of face: Secondary | ICD-10-CM | POA: Diagnosis not present

## 2017-01-04 DIAGNOSIS — L0100 Impetigo, unspecified: Secondary | ICD-10-CM | POA: Diagnosis not present

## 2017-01-04 DIAGNOSIS — B9689 Other specified bacterial agents as the cause of diseases classified elsewhere: Secondary | ICD-10-CM | POA: Diagnosis not present

## 2017-01-07 IMAGING — US ULTRASOUND RIGHT BREAST LIMITED
1 series · 6 of 6 positions shown · non-contrast
Comparison: Previous exam(s).

CLINICAL DATA: Callback from screening mammogram for possible mass
right breast

EXAM:
2D DIGITAL DIAGNOSTIC RIGHT MAMMOGRAM WITH CAD AND ADJUNCT TOMO
ULTRASOUND RIGHT BREAST

[Series 1: ultrasound right breast limited · 0.06mm/px · 6 of 6 slices shown]
[im 1/6]
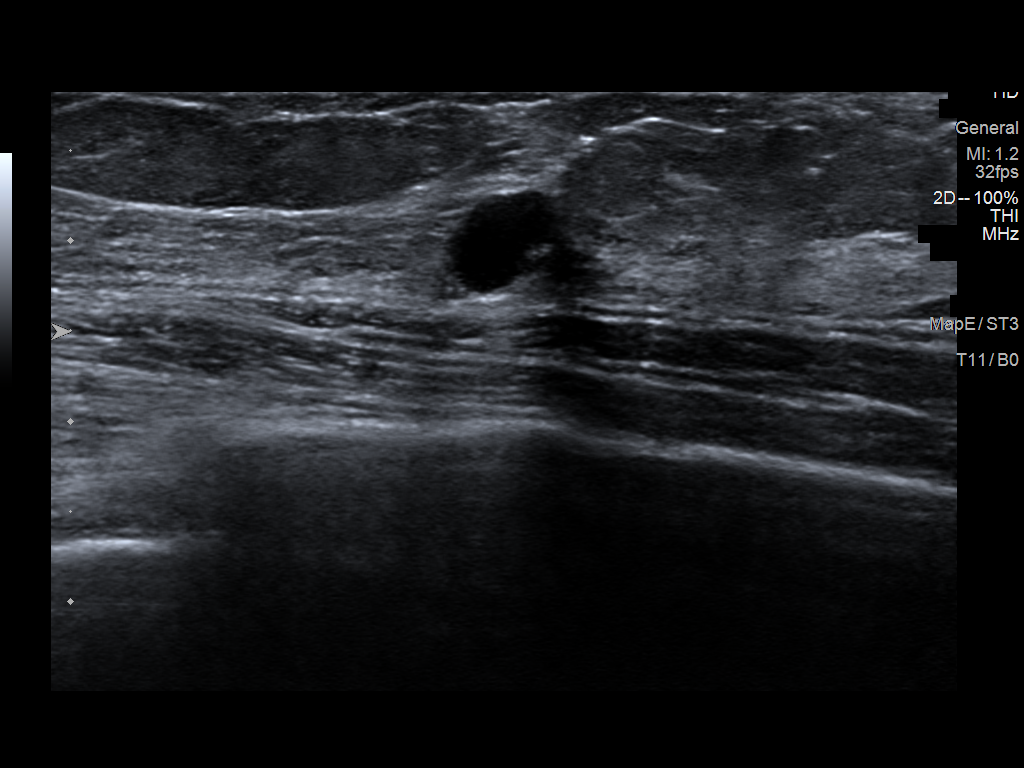
[im 2/6]
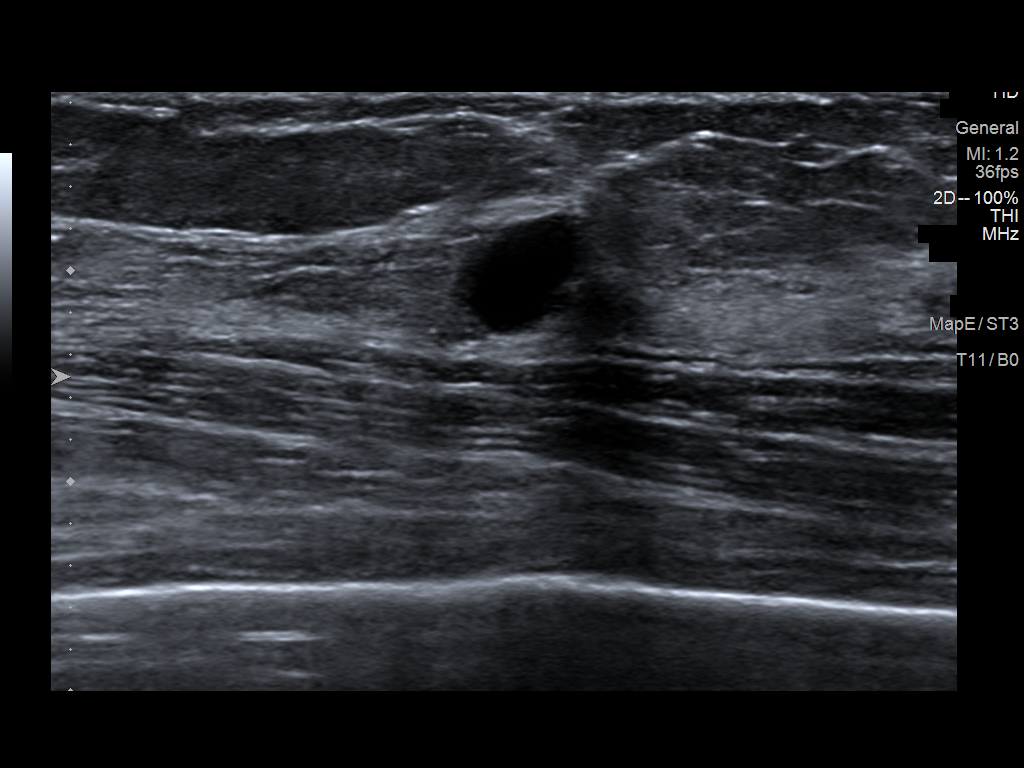
[im 3/6]
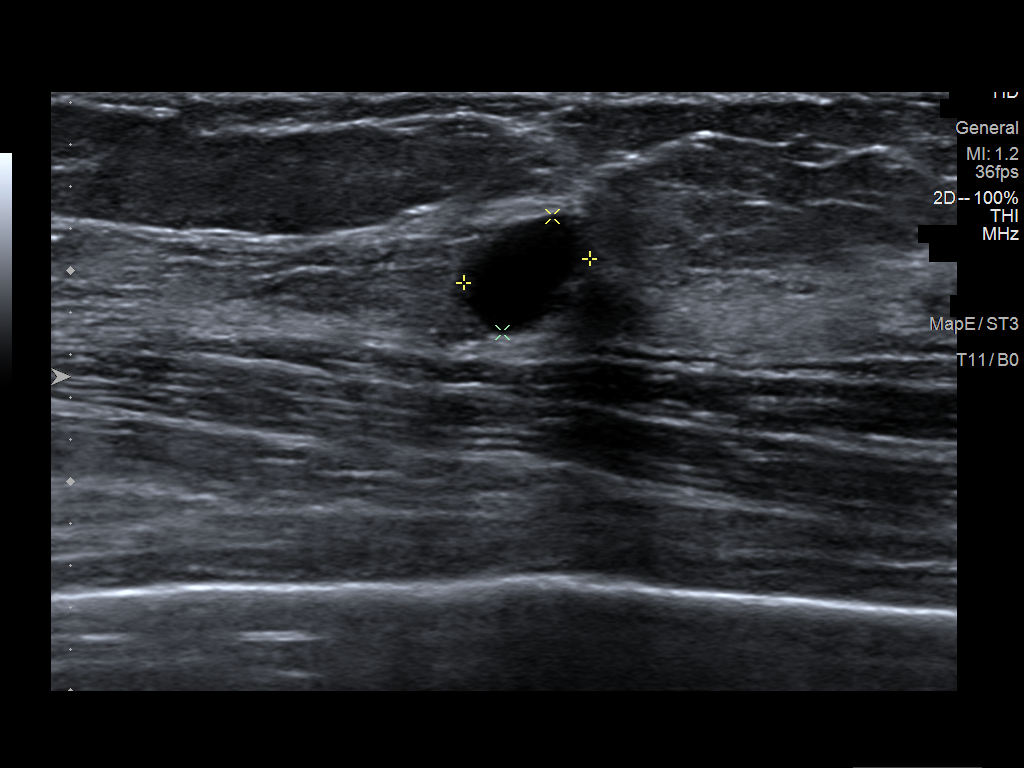
[im 4/6]
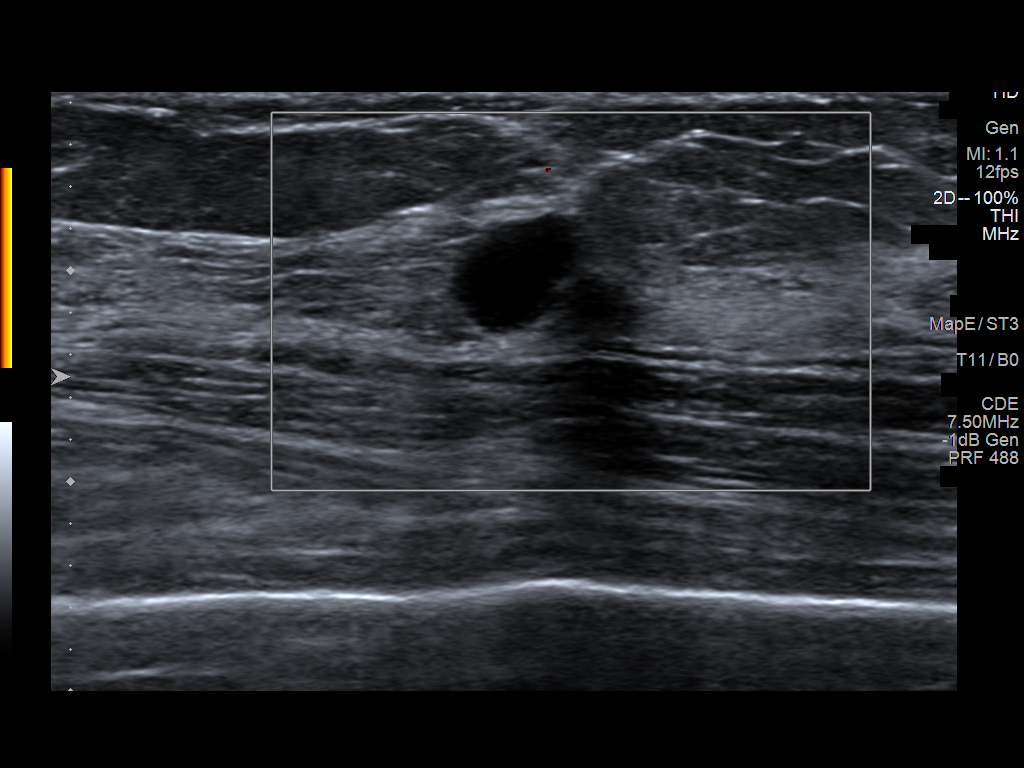
[im 5/6]
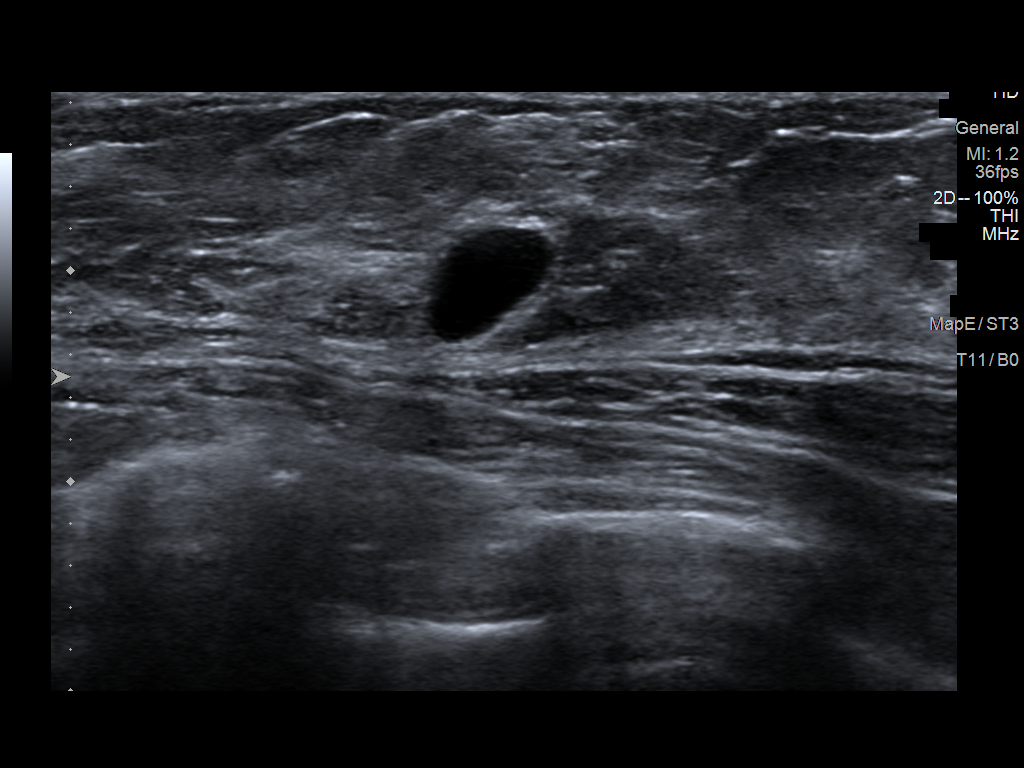
[im 6/6]
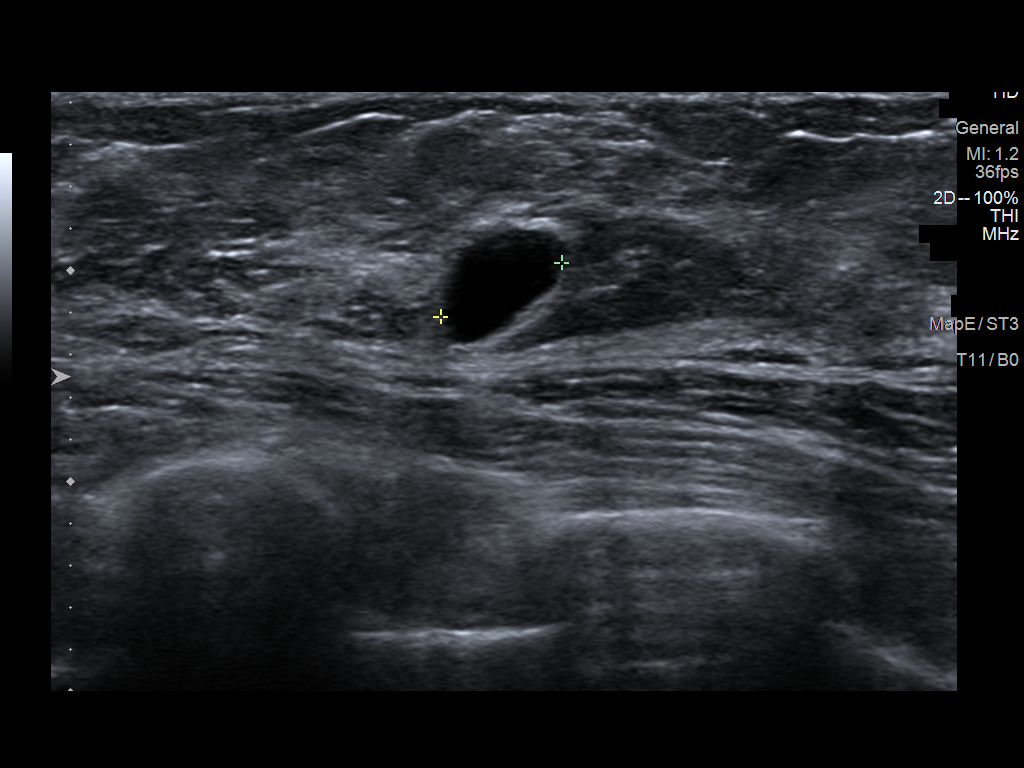

[6 of 6 positions shown; findings below may reference images not displayed]

ACR Breast Density Category b: There are scattered areas of
fibroglandular density.
FINDINGS: Cc and MLO views of the right breast are submitted. The previously
questioned mass in the posterior upper-outer quadrant right breast
persists. Previously questioned mass in the central posterior slight
lower right breast does not persist.

Mammographic images were processed with CAD.

Targeted ultrasound is performed, showing 0.6 x 0.6 x 0.6 cm simple
cyst at the right breast 10 o'clock 4 cm from nipple correlating to
the mammographic finding.
IMPRESSION: Benign findings.

RECOMMENDATION:
Routine screening mammogram back on schedule.

I have discussed the findings and recommendations with the patient.
Results were also provided in writing at the conclusion of the
visit. If applicable, a reminder letter will be sent to the patient
regarding the next appointment.

BI-RADS CATEGORY  2: Benign.

## 2017-01-07 IMAGING — MG 2D DIGITAL DIAGNOSTIC UNILATERAL RIGHT MAMMOGRAM WITH CAD AND AD
3 series · 3 of 11 positions shown · non-contrast
Comparison: Previous exam(s).

CLINICAL DATA: Callback from screening mammogram for possible mass
right breast

EXAM:
2D DIGITAL DIAGNOSTIC RIGHT MAMMOGRAM WITH CAD AND ADJUNCT TOMO
ULTRASOUND RIGHT BREAST

[R MLO synth-2D]
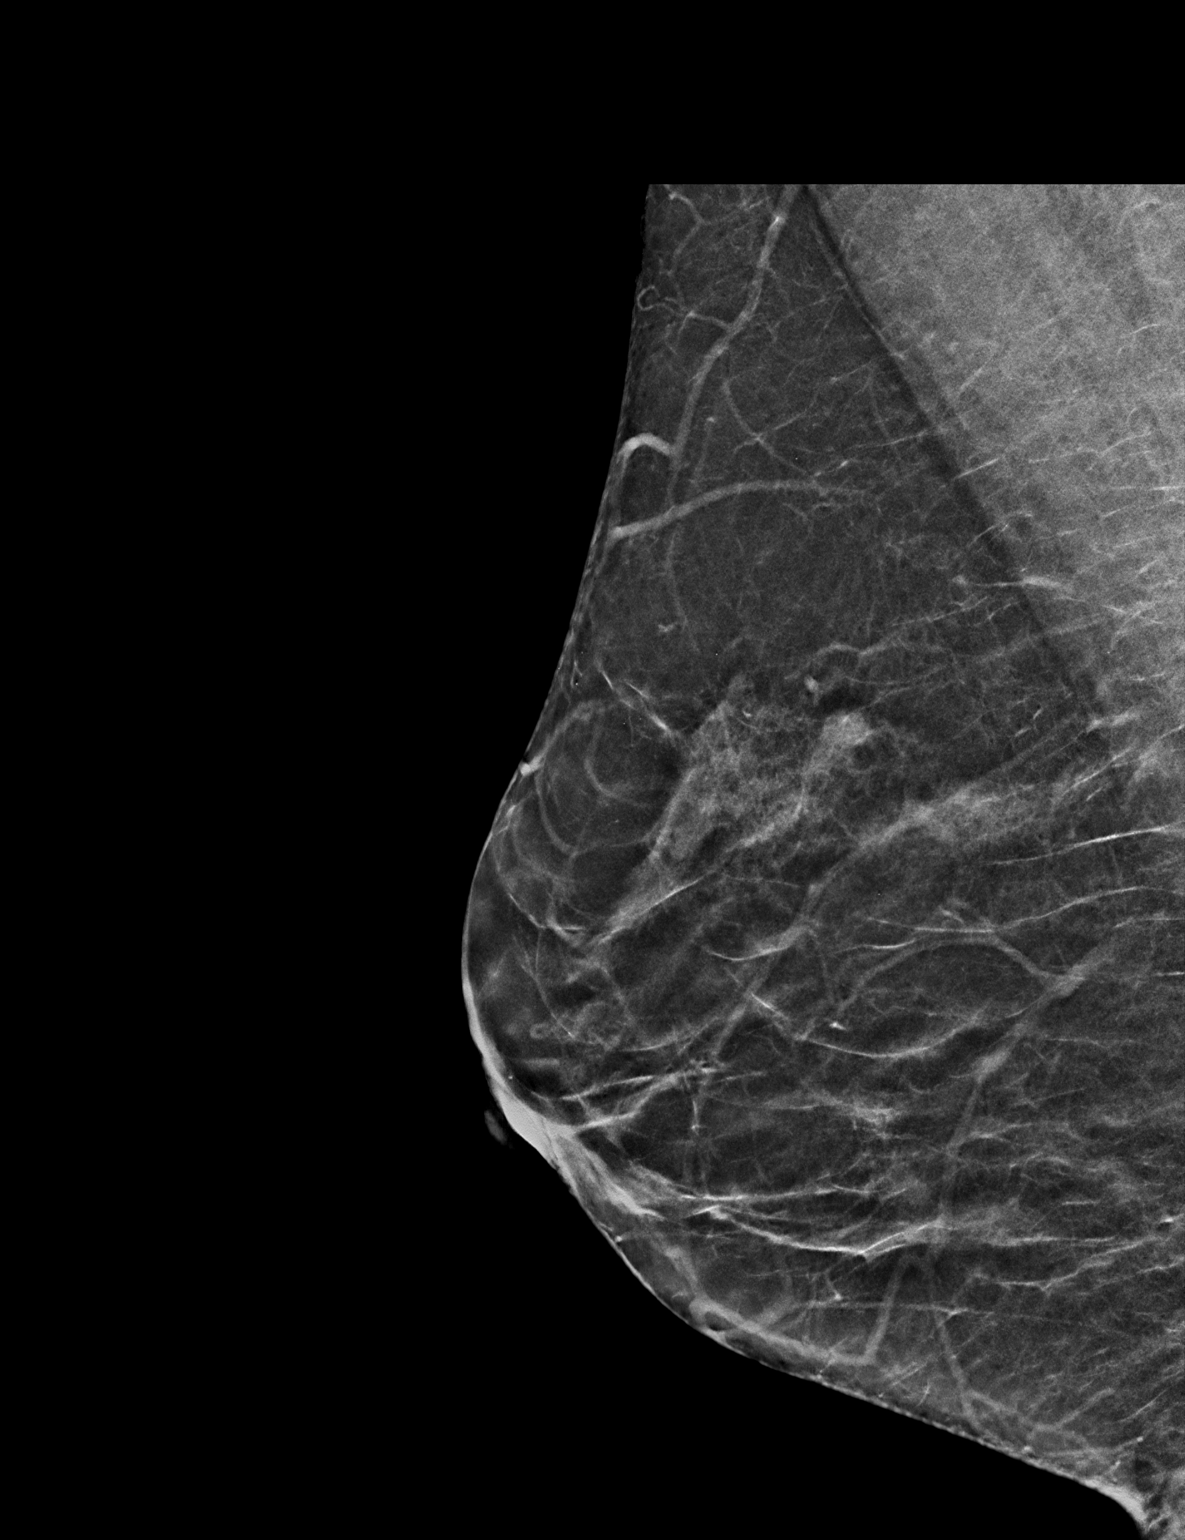

[R MLO tomo · tomo slice 24/47.0]
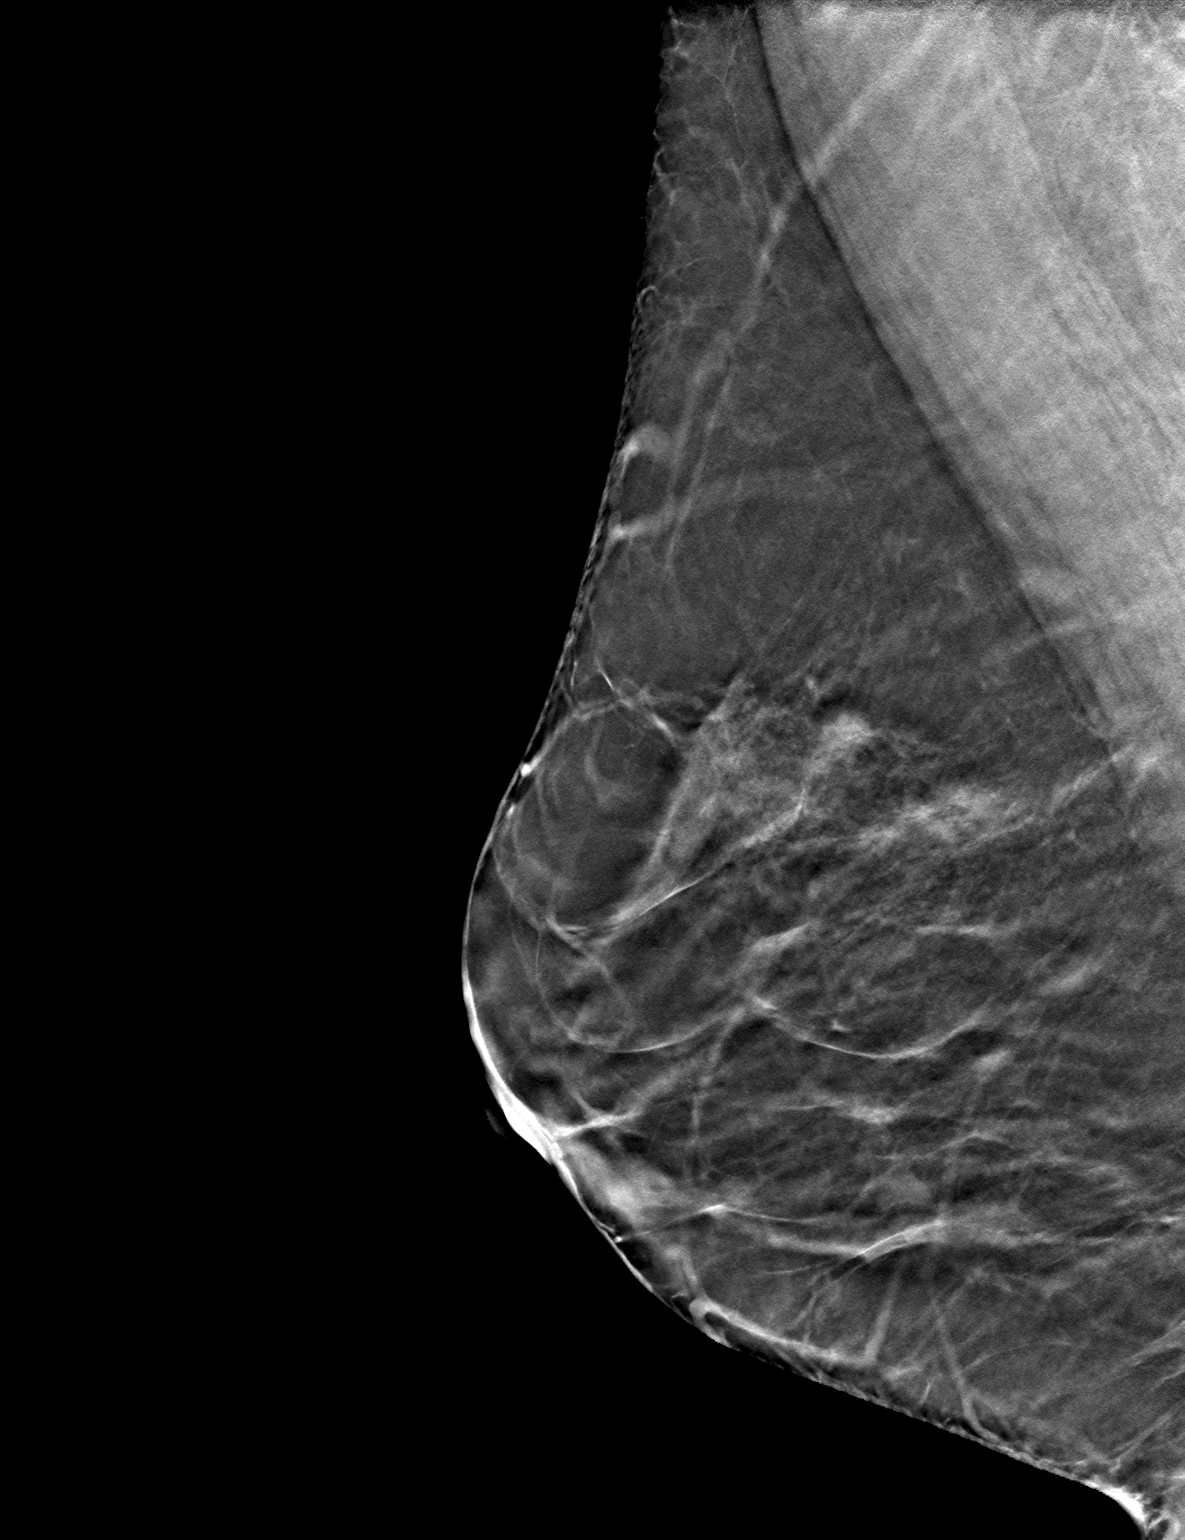

[R CC tomo · tomo slice 23/45.0]
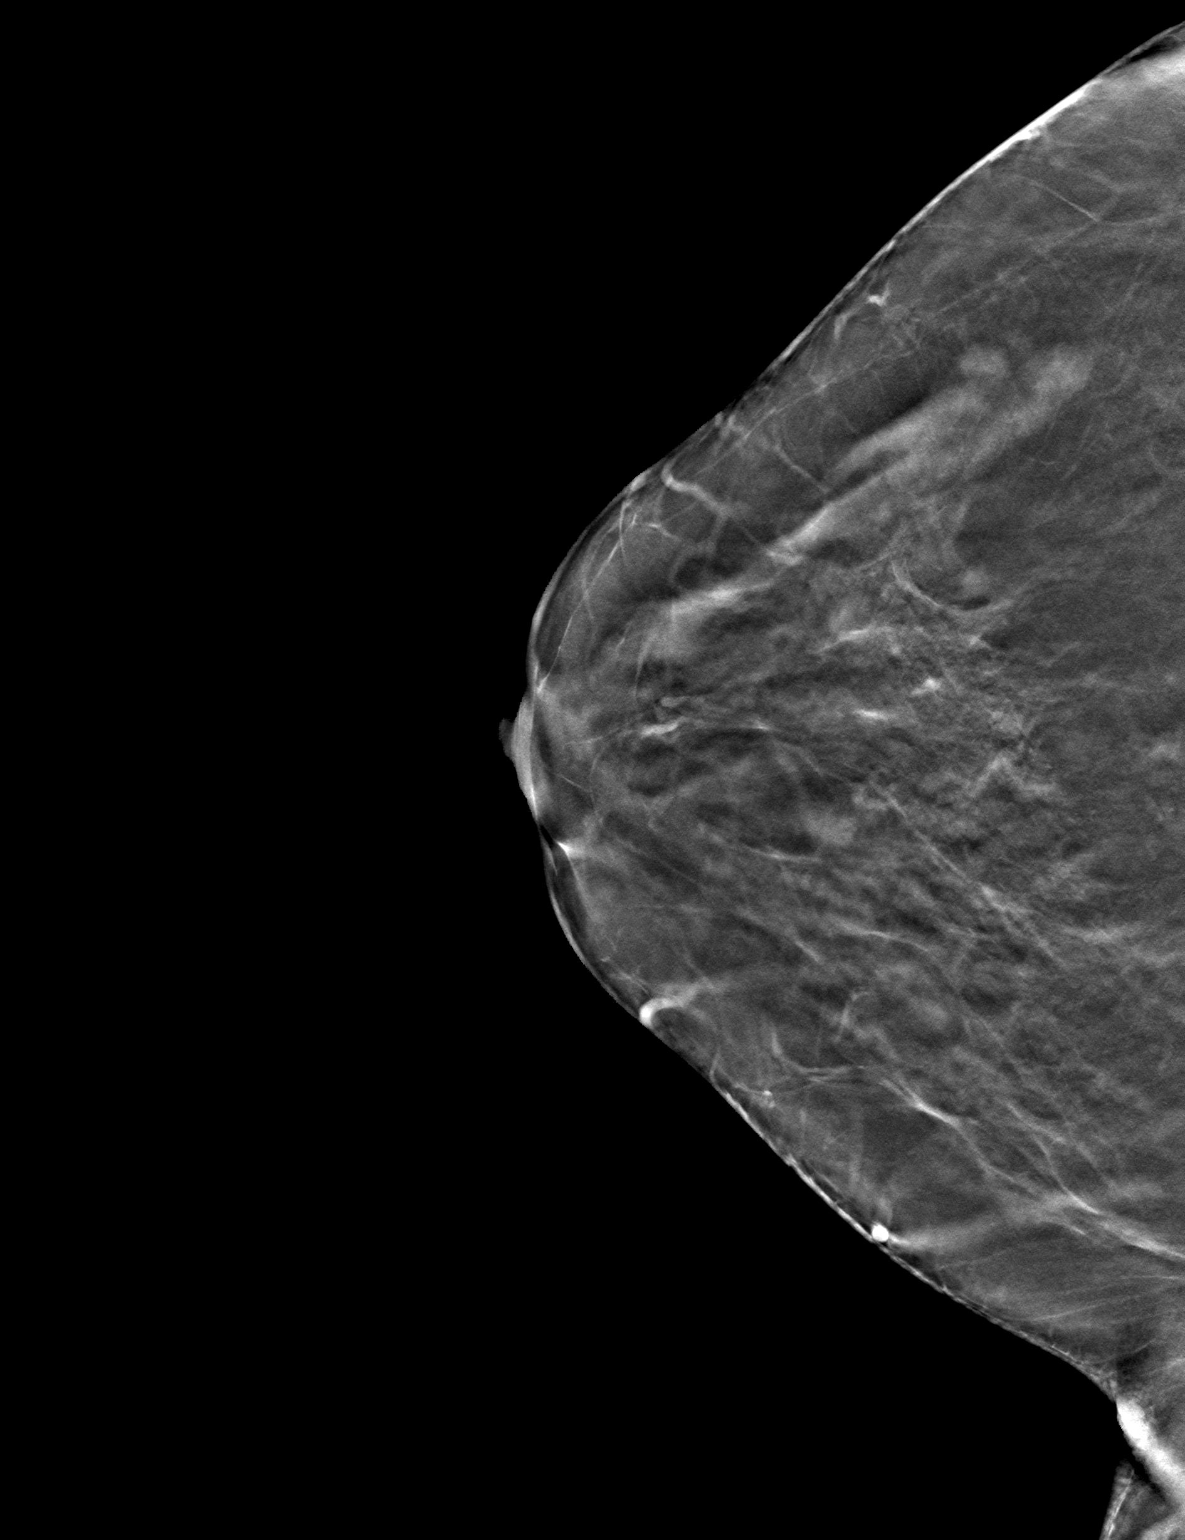

[3 of 11 positions shown; findings below may reference images not displayed]

ACR Breast Density Category b: There are scattered areas of
fibroglandular density.
FINDINGS: Cc and MLO views of the right breast are submitted. The previously
questioned mass in the posterior upper-outer quadrant right breast
persists. Previously questioned mass in the central posterior slight
lower right breast does not persist.

Mammographic images were processed with CAD.

Targeted ultrasound is performed, showing 0.6 x 0.6 x 0.6 cm simple
cyst at the right breast 10 o'clock 4 cm from nipple correlating to
the mammographic finding.
IMPRESSION: Benign findings.

RECOMMENDATION:
Routine screening mammogram back on schedule.

I have discussed the findings and recommendations with the patient.
Results were also provided in writing at the conclusion of the
visit. If applicable, a reminder letter will be sent to the patient
regarding the next appointment.

BI-RADS CATEGORY  2: Benign.

## 2017-08-16 DIAGNOSIS — Z6823 Body mass index (BMI) 23.0-23.9, adult: Secondary | ICD-10-CM | POA: Diagnosis not present

## 2017-08-16 DIAGNOSIS — Z01419 Encounter for gynecological examination (general) (routine) without abnormal findings: Secondary | ICD-10-CM | POA: Diagnosis not present

## 2017-10-10 DIAGNOSIS — E782 Mixed hyperlipidemia: Secondary | ICD-10-CM | POA: Diagnosis not present

## 2017-10-10 DIAGNOSIS — Z0001 Encounter for general adult medical examination with abnormal findings: Secondary | ICD-10-CM | POA: Diagnosis not present

## 2017-10-10 DIAGNOSIS — G43909 Migraine, unspecified, not intractable, without status migrainosus: Secondary | ICD-10-CM | POA: Diagnosis not present

## 2017-10-10 DIAGNOSIS — Z1389 Encounter for screening for other disorder: Secondary | ICD-10-CM | POA: Diagnosis not present

## 2017-11-19 DIAGNOSIS — S61001D Unspecified open wound of right thumb without damage to nail, subsequent encounter: Secondary | ICD-10-CM | POA: Diagnosis not present

## 2017-12-13 DIAGNOSIS — E782 Mixed hyperlipidemia: Secondary | ICD-10-CM | POA: Diagnosis not present

## 2018-02-09 DIAGNOSIS — E02 Subclinical iodine-deficiency hypothyroidism: Secondary | ICD-10-CM | POA: Diagnosis not present

## 2018-08-14 DIAGNOSIS — Z111 Encounter for screening for respiratory tuberculosis: Secondary | ICD-10-CM | POA: Diagnosis not present

## 2018-09-14 DIAGNOSIS — N342 Other urethritis: Secondary | ICD-10-CM | POA: Diagnosis not present

## 2018-09-14 DIAGNOSIS — Z6824 Body mass index (BMI) 24.0-24.9, adult: Secondary | ICD-10-CM | POA: Diagnosis not present

## 2018-09-14 DIAGNOSIS — R350 Frequency of micturition: Secondary | ICD-10-CM | POA: Diagnosis not present

## 2018-10-04 DIAGNOSIS — Z01419 Encounter for gynecological examination (general) (routine) without abnormal findings: Secondary | ICD-10-CM | POA: Diagnosis not present

## 2018-10-04 DIAGNOSIS — Z6824 Body mass index (BMI) 24.0-24.9, adult: Secondary | ICD-10-CM | POA: Diagnosis not present

## 2019-05-07 ENCOUNTER — Other Ambulatory Visit: Payer: Self-pay

## 2019-05-07 ENCOUNTER — Other Ambulatory Visit: Payer: 59

## 2019-05-07 DIAGNOSIS — Z20822 Contact with and (suspected) exposure to covid-19: Secondary | ICD-10-CM

## 2019-05-11 LAB — NOVEL CORONAVIRUS, NAA: SARS-CoV-2, NAA: NOT DETECTED

## 2019-11-20 ENCOUNTER — Encounter (HOSPITAL_COMMUNITY): Payer: Self-pay | Admitting: Emergency Medicine

## 2019-11-20 ENCOUNTER — Emergency Department (HOSPITAL_COMMUNITY)
Admission: EM | Admit: 2019-11-20 | Discharge: 2019-11-20 | Disposition: A | Discharge status: Home / Self Care | Payer: 59 | Attending: Emergency Medicine | Admitting: Emergency Medicine

## 2019-11-20 ENCOUNTER — Other Ambulatory Visit: Payer: Self-pay

## 2019-11-20 DIAGNOSIS — Y92410 Unspecified street and highway as the place of occurrence of the external cause: Secondary | ICD-10-CM | POA: Diagnosis not present

## 2019-11-20 DIAGNOSIS — Z79899 Other long term (current) drug therapy: Secondary | ICD-10-CM | POA: Diagnosis not present

## 2019-11-20 DIAGNOSIS — R519 Headache, unspecified: Secondary | ICD-10-CM | POA: Insufficient documentation

## 2019-11-20 DIAGNOSIS — Y999 Unspecified external cause status: Secondary | ICD-10-CM | POA: Diagnosis not present

## 2019-11-20 DIAGNOSIS — Y9389 Activity, other specified: Secondary | ICD-10-CM | POA: Diagnosis not present

## 2019-11-20 MED ORDER — NAPROXEN 375 MG PO TABS: 375.0000 mg | ORAL_TABLET | Freq: Two times a day (BID) | ORAL | 0 refills | Status: AC

## 2019-11-20 MED ORDER — KETOROLAC TROMETHAMINE 30 MG/ML IJ SOLN
30.0000 mg | Freq: Once | INTRAMUSCULAR | Status: AC
Start: 2019-11-20 — End: 2019-11-20
  Administered 2019-11-20: 30 mg via INTRAMUSCULAR
  Filled 2019-11-20: qty 1

## 2019-11-20 MED ORDER — ONDANSETRON 4 MG PO TBDP
4.0000 mg | ORAL_TABLET | Freq: Once | ORAL | Status: AC
Start: 2019-11-20 — End: 2019-11-20
  Administered 2019-11-20: 4 mg via ORAL
  Filled 2019-11-20: qty 1

## 2019-11-20 MED ORDER — CYCLOBENZAPRINE HCL 10 MG PO TABS: 10.0000 mg | ORAL_TABLET | Freq: Two times a day (BID) | ORAL | 0 refills | Status: AC
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
## 2019-11-20 NOTE — Discharge Instructions (Signed)
I have prescribed muscle relaxers for your pain, please do not drink or drive while taking this medications as they can make you drowsy.  Please follow-up with PCP in 1 week for reevaluation of your symptoms.  You experience any bowel or bladder incontinence, fever, worsening in your symptoms please return to the ED. ° °
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
